# Patient Record
Sex: Male | Born: 1995 | Race: Black or African American | Hispanic: No | Marital: Single | Smoking: Never smoker
Health system: Southern US, Community
[De-identification: ages and names within clinical notes are randomized; demographics above are authoritative.]

## PROBLEM LIST (undated history)

## (undated) DIAGNOSIS — J45909 Unspecified asthma, uncomplicated: Secondary | ICD-10-CM

## (undated) DIAGNOSIS — Z8659 Personal history of other mental and behavioral disorders: Secondary | ICD-10-CM

## (undated) DIAGNOSIS — L309 Dermatitis, unspecified: Secondary | ICD-10-CM

## (undated) DIAGNOSIS — F909 Attention-deficit hyperactivity disorder, unspecified type: Secondary | ICD-10-CM

## (undated) DIAGNOSIS — T7840XA Allergy, unspecified, initial encounter: Secondary | ICD-10-CM

## (undated) DIAGNOSIS — R4689 Other symptoms and signs involving appearance and behavior: Secondary | ICD-10-CM

## (undated) DIAGNOSIS — Z62820 Parent-biological child conflict: Secondary | ICD-10-CM

## (undated) HISTORY — DX: Allergy, unspecified, initial encounter: T78.40XA

## (undated) HISTORY — DX: Unspecified asthma, uncomplicated: J45.909

---
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
## 2017-02-01 ENCOUNTER — Encounter: Payer: Self-pay | Admitting: Allergy and Immunology

## 2017-02-01 ENCOUNTER — Ambulatory Visit (INDEPENDENT_AMBULATORY_CARE_PROVIDER_SITE_OTHER): Payer: BLUE CROSS/BLUE SHIELD | Admitting: Allergy and Immunology

## 2017-02-01 VITALS — BP 140/80 | HR 100 | Resp 18 | Ht 71.0 in | Wt 280.0 lb

## 2017-02-01 DIAGNOSIS — L2089 Other atopic dermatitis: Secondary | ICD-10-CM | POA: Diagnosis not present

## 2017-02-01 DIAGNOSIS — Z91018 Allergy to other foods: Secondary | ICD-10-CM | POA: Diagnosis not present

## 2017-02-01 DIAGNOSIS — J4520 Mild intermittent asthma, uncomplicated: Secondary | ICD-10-CM

## 2017-02-01 DIAGNOSIS — J3089 Other allergic rhinitis: Secondary | ICD-10-CM

## 2017-02-01 MED ORDER — AUVI-Q 0.3 MG/0.3ML IJ SOAJ: INTRAMUSCULAR | 3 refills | Status: DC

## 2017-02-01 MED ORDER — ALBUTEROL SULFATE 108 (90 BASE) MCG/ACT IN AEPB: 2.0000 | INHALATION_SPRAY | RESPIRATORY_TRACT | 1 refills | Status: AC | PRN

## 2017-02-01 NOTE — Patient Instructions (Addendum)
  1. OTC antihistamine if needed. OTC moisturizer if needed  2. Auvi-Q 0.3, Benadryl, M.D./ER evaluation for severe allergic reaction  3. Proventil HFA or similar 2 inhalations every 4-6 hours if needed  4. OTC Mucinex DM 2 tablets twice a day if needed  5. Return to clinic in 1 year or earlier if problem  6. Restart immunotherapy? Not at this point in time

## 2017-02-01 NOTE — Progress Notes (Signed)
Follow-up Note  Referring Provider: Dorothyann Peng, MD Primary Provider: Dorothyann Peng, MD Date of Office Visit: 02/01/2017  Subjective:   Jonathan Zamora (DOB: [birth year]) is a 21 y.o. male who [facility] in re-evaluation of the following:  HPI: Jonathan Zamora presents to this clinic in evaluation of his allergic rhinitis, asthma, atopic dermatitis and history of food allergy directed against tree nuts. I have not seen him in this clinic since April 2016.  He was using immunotherapy to treat his atopic disease and he discontinued this form of therapy in September 2017 while he was utilizing an every 4 week schedule. Logistically it was just difficult for him to continue.  He did very well without any problems until about late February at which point time he became acutely sick with nasal congestion and coughing and sneezing and may be wheezing and posttussive emesis for which he went to the infirmary at [facility] and was treated with antibiotics. Apparently he did very well with therapy and since then has had very little problems with his nose or eyes or throat or chest or stomach. Basically he is asymptomatic at this point in time. He does not use any nasal steroids or antihistamines or inhaled steroids or a short acting bronchodilator.  He does remain away from tree nut consumption.  His eczema is rarely active and he has no need to use any topical steroid.  Allergies as of 02/01/2017      Reactions   Other Anaphylaxis   ALL Tree nuts      Medication List      benzonatate 100 MG capsule Commonly known as:  TESSALON Take 1-2 capsules (100-200 mg total) by mouth 3 (three) times daily as needed for cough.   cetirizine 10 MG tablet Commonly known as:  ZYRTEC Take 10 mg by mouth daily.   CONCERTA 36 MG CR tablet Generic drug:  methylphenidate   EPIPEN 2-PAK 0.3 mg/0.3 mL Soaj injection Generic drug:   EPINEPHrine USE AS DIRECTED FOR LIFE THREATENING ALLERGIC REACTIONS   fluticasone 50 MCG/ACT nasal spray Commonly known as:  FLONASE Place 1 spray into both nostrils daily.   guanFACINE 2 MG tablet Commonly known as:  TENEX Take 1 tablet (2 mg total) by mouth 2 (two) times daily.   PROAIR RESPICLICK 108 (90 Base) MCG/ACT Aepb Generic drug:  Albuterol Sulfate USE 2 INHALATIONS EVERY 4 TO 6 HOURS AS NEEDED FOR COUGH OR WHEEZE.       Past Medical History:  Diagnosis Date  . ADHD (attention deficit hyperactivity disorder)   . Aggression   . Allergy   . Asthma   . Eczema   . History of ADHD 08/10/2015  . Parent-child relational problem 08/10/2015    History reviewed. No pertinent surgical history.  Review of systems negative except as noted in HPI / PMHx or noted below:  Review of Systems  Constitutional: Negative.   HENT: Negative.   Eyes: Negative.   Respiratory: Negative.   Cardiovascular: Negative.   Gastrointestinal: Negative.   Genitourinary: Negative.   Musculoskeletal: Negative.   Skin: Negative.   Neurological: Negative.   Endo/Heme/Allergies: Negative.   Psychiatric/Behavioral: Negative.      Objective:   Vitals:   02/01/17 1534  BP: 140/80  Pulse: 100  Resp: 18   Height: 5\' 11"  (180.3 cm)  Weight: 280 lb (127 kg)   Physical Exam  Constitutional: He is well-developed, well-nourished, and in no  distress.  HENT:  Head: Normocephalic.  Right Ear: Tympanic membrane, external ear and ear canal normal.  Left Ear: Tympanic membrane, external ear and ear canal normal.  Nose: Nose normal. No mucosal edema or rhinorrhea.  Mouth/Throat: Uvula is midline, oropharynx is clear and moist and mucous membranes are normal. No oropharyngeal exudate.  Eyes: Conjunctivae are normal.  Neck: Trachea normal. No tracheal tenderness present. No tracheal deviation present. No thyromegaly present.  Cardiovascular: Normal rate, regular rhythm, S1 normal, S2 normal and  normal heart sounds.   No murmur heard. Pulmonary/Chest: Breath sounds normal. No stridor. No respiratory distress. He has no wheezes. He has no rales.  Musculoskeletal: He exhibits no edema.  Lymphadenopathy:       Head (right side): No tonsillar adenopathy present.       Head (left side): No tonsillar adenopathy present.    He has no cervical adenopathy.  Neurological: He is alert. Gait normal.  Skin: No rash noted. He is not diaphoretic. No erythema. Nails show no clubbing.  Psychiatric: Mood and affect normal.    Diagnostics:    Spirometry was performed and demonstrated an FEV1 of 3.65 at 97 % of predicted.  The patient had an Asthma Control Test with the following results:  .    Assessment and Plan:   1. Asthma, mild intermittent, well-controlled   2. Other allergic rhinitis   3. Food allergy   4. Other atopic dermatitis     1. OTC antihistamine if needed. OTC moisturizer if needed ` 2. Auvi-Q 0.3, Benadryl, M.D./ER evaluation for severe allergic reaction  3. Proventil HFA or similar 2 inhalations every 4-6 hours if needed  4. OTC Mucinex DM 2 tablets twice a day if needed  5. Return to clinic in 1 year or earlier if problem  6. Restart immunotherapy? Not at this point in time  [name] appears to be doing relatively well. I suspect that the event that occurred late February was probably tied up with a viral respiratory tract infection and we will make that assumption and not have him use any medications in a preventative manner or have him restart immunotherapy. We will see what happens as we go through this next year and if he has significant problems as he progresses then we will need to have him reapproach this issue and possibly consider restarting him on a course immunotherapy. I will see him back in this clinic in 1 year or earlier if there is a problem.  Laurette SchimkeEric Senita Corredor, MD Allergy / Immunology [facility]

## 2017-02-02 NOTE — Addendum Note (Signed)
Addended by: Berna BueWHITAKER, CARRIE L on: 02/02/2017 12:10 PM   Modules accepted: Orders
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
## 2017-11-01 ENCOUNTER — Emergency Department (HOSPITAL_COMMUNITY)
Admission: EM | Admit: 2017-11-01 | Discharge: 2017-11-01 | Disposition: A | Discharge status: Other Acute Inpatient Hospital | Payer: Self-pay | Attending: Emergency Medicine | Admitting: Emergency Medicine

## 2017-11-01 ENCOUNTER — Inpatient Hospital Stay (HOSPITAL_COMMUNITY)
Admission: AD | Admit: 2017-11-01 | Discharge: 2017-11-04 | DRG: 883 | Disposition: A | Discharge status: Home / Self Care | Payer: Federal, State, Local not specified - Other | Source: Intra-hospital | Attending: Psychiatry | Admitting: Psychiatry

## 2017-11-01 ENCOUNTER — Encounter (HOSPITAL_COMMUNITY): Payer: Self-pay

## 2017-11-01 ENCOUNTER — Other Ambulatory Visit: Payer: Self-pay

## 2017-11-01 DIAGNOSIS — R4689 Other symptoms and signs involving appearance and behavior: Secondary | ICD-10-CM | POA: Insufficient documentation

## 2017-11-01 DIAGNOSIS — F909 Attention-deficit hyperactivity disorder, unspecified type: Secondary | ICD-10-CM | POA: Diagnosis present

## 2017-11-01 DIAGNOSIS — Z23 Encounter for immunization: Secondary | ICD-10-CM

## 2017-11-01 DIAGNOSIS — F6381 Intermittent explosive disorder: Principal | ICD-10-CM | POA: Diagnosis present

## 2017-11-01 DIAGNOSIS — Z91018 Allergy to other foods: Secondary | ICD-10-CM | POA: Diagnosis not present

## 2017-11-01 DIAGNOSIS — F329 Major depressive disorder, single episode, unspecified: Secondary | ICD-10-CM | POA: Diagnosis present

## 2017-11-01 DIAGNOSIS — Z008 Encounter for other general examination: Secondary | ICD-10-CM

## 2017-11-01 DIAGNOSIS — J45909 Unspecified asthma, uncomplicated: Secondary | ICD-10-CM | POA: Diagnosis present

## 2017-11-01 DIAGNOSIS — R45851 Suicidal ideations: Secondary | ICD-10-CM | POA: Diagnosis present

## 2017-11-01 DIAGNOSIS — L209 Atopic dermatitis, unspecified: Secondary | ICD-10-CM | POA: Diagnosis present

## 2017-11-01 DIAGNOSIS — Z046 Encounter for general psychiatric examination, requested by authority: Secondary | ICD-10-CM

## 2017-11-01 LAB — CBC
HCT: 42.6 % (ref 39.0–52.0)
Hemoglobin: 14.7 g/dL (ref 13.0–17.0)
MCH: 29.8 pg (ref 26.0–34.0)
MCHC: 34.5 g/dL (ref 30.0–36.0)
MCV: 86.4 fL (ref 78.0–100.0)
Platelets: 281 10*3/uL (ref 150–400)
RBC: 4.93 MIL/uL (ref 4.22–5.81)
RDW: 12.5 % (ref 11.5–15.5)
WBC: 7.1 10*3/uL (ref 4.0–10.5)

## 2017-11-01 LAB — COMPREHENSIVE METABOLIC PANEL
ALT: 22 U/L (ref 17–63)
AST: 27 U/L (ref 15–41)
Albumin: 4.6 g/dL (ref 3.5–5.0)
Alkaline Phosphatase: 81 U/L (ref 38–126)
Anion gap: 10 (ref 5–15)
BUN: 10 mg/dL (ref 6–20)
CO2: 24 mmol/L (ref 22–32)
Calcium: 9.3 mg/dL (ref 8.9–10.3)
Chloride: 106 mmol/L (ref 101–111)
Creatinine, Ser: 0.97 mg/dL (ref 0.61–1.24)
GFR calc Af Amer: >60 mL/min (ref >60)
GFR calc non Af Amer: >60 mL/min (ref >60)
Glucose, Bld: 92 mg/dL (ref 65–99)
Potassium: 3.7 mmol/L (ref 3.5–5.1)
Sodium: 140 mmol/L (ref 135–145)
Total Bilirubin: 1.3 mg/dL — ABNORMAL HIGH (ref 0.3–1.2)
Total Protein: 7.4 g/dL (ref 6.5–8.1)

## 2017-11-01 LAB — RAPID URINE DRUG SCREEN, HOSP PERFORMED
Amphetamines: NOT DETECTED
Barbiturates: NOT DETECTED
Benzodiazepines: NOT DETECTED
Cocaine: NOT DETECTED
Opiates: NOT DETECTED
Tetrahydrocannabinol: POSITIVE — AB

## 2017-11-01 LAB — ACETAMINOPHEN LEVEL: Acetaminophen (Tylenol), Serum: <10 ug/mL — ABNORMAL LOW (ref 10–30)

## 2017-11-01 LAB — ETHANOL: Alcohol, Ethyl (B): <10 mg/dL (ref <10)

## 2017-11-01 LAB — SALICYLATE LEVEL: Salicylate Lvl: <7 mg/dL (ref 2.8–30.0)

## 2017-11-01 MED ORDER — TRAZODONE HCL 50 MG PO TABS: 50.0000 mg | ORAL_TABLET | Freq: Every evening | ORAL | Status: DC | PRN

## 2017-11-01 MED ORDER — ALUM & MAG HYDROXIDE-SIMETH 200-200-20 MG/5ML PO SUSP: 30.0000 mL | ORAL | Status: DC | PRN

## 2017-11-01 MED ORDER — INFLUENZA VAC SPLIT QUAD 0.5 ML IM SUSY
0.5000 mL | PREFILLED_SYRINGE | INTRAMUSCULAR | Status: AC
  Administered 2017-11-02: 0.5 mL via INTRAMUSCULAR
  Filled 2017-11-01: qty 0.5

## 2017-11-01 MED ORDER — ACETAMINOPHEN 325 MG PO TABS: 650.0000 mg | ORAL_TABLET | ORAL | Status: DC | PRN

## 2017-11-01 MED ORDER — MAGNESIUM HYDROXIDE 400 MG/5ML PO SUSP: 30.0000 mL | Freq: Every day | ORAL | Status: DC | PRN

## 2017-11-01 MED ORDER — PNEUMOCOCCAL VAC POLYVALENT 25 MCG/0.5ML IJ INJ: 0.5000 mL | INJECTION | INTRAMUSCULAR | Status: DC

## 2017-11-01 MED ORDER — HYDROXYZINE HCL 25 MG PO TABS: 25.0000 mg | ORAL_TABLET | Freq: Three times a day (TID) | ORAL | Status: DC | PRN

## 2017-11-01 MED ORDER — ACETAMINOPHEN 325 MG PO TABS: 650.0000 mg | ORAL_TABLET | Freq: Four times a day (QID) | ORAL | Status: DC | PRN

## 2017-11-01 MED ORDER — ONDANSETRON HCL 4 MG PO TABS: 4.0000 mg | ORAL_TABLET | Freq: Three times a day (TID) | ORAL | Status: DC | PRN
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
## 2017-11-01 NOTE — ED Notes (Signed)
Patient being evaluated by TTS °

## 2017-11-01 NOTE — ED Notes (Signed)
[facility] for continuation of specialized care. Pt left in no acute distress. Belongings signed for and given to father to take home. Pt left in no acute distress.

## 2017-11-01 NOTE — ED Notes (Signed)
Patient currently denies SI/HI/AVH at this time. Plan of care discussed. EDP at bedside to discuss treatment plan. Encouragement and support provided and safety maintain. Q 15 min safety checks remain in place and video monitoring.

## 2017-11-01 NOTE — ED Notes (Addendum)
[facility] [employer] contacted for patient transport to [facility]. 

## 2017-11-01 NOTE — Tx Team (Signed)
Initial Treatment Plan 11/01/2017 10:48 PM Jonathan Zamora G Silvester UEA:[id number]    PATIENT STRESSORS: Financial difficulties Marital or family conflict Substance abuse   PATIENT STRENGTHS: Wellsite geologistCommunication skills General fund of knowledge Motivation for treatment/growth Physical Health   PATIENT IDENTIFIED PROBLEMS: Depression  Suicidal ideation  Aggitation  "I want help with dealing with stress better"               DISCHARGE CRITERIA:  Improved stabilization in mood, thinking, and/or behavior Verbal commitment to aftercare and medication compliance  PRELIMINARY DISCHARGE PLAN: Outpatient therapy Medication management  PATIENT/FAMILY INVOLVEMENT: This treatment plan has been presented to and reviewed with the patient, Jonathan Zamora G Pettaway.  The patient and family have been given the opportunity to ask questions and make suggestions.  Levin BaconHeather V Chrystie Hagwood, RN 11/01/2017, 10:48 PM
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
## 2017-11-01 NOTE — ED Notes (Signed)
No E-signature required patient is IVC'D

## 2017-11-01 NOTE — [facility] Counselor (Signed)
Pt accepted to [facility] per Tanna SavoyEric AC. Can come after 1930. Dr. Sharma CovertNorman Accepting, Dr. Jama Flavorsobos attending. RN notified.   RN to RN report [phone]   Kateri PlummerKristin Felton Buczynski, Darcey NoraLCAS, [facility] Lead Triage Specialist  [facility]  Therapeutic Triage Services Phone: [phone] Fax: [phone]

## 2017-11-01 NOTE — ED Notes (Signed)
Pt oriented to room and unit.  Pt is quiet and withdrawn.  Pt was wanded again and checked for contraband.  15 minute checks and video monitoring in place.

## 2017-11-01 NOTE — ED Notes (Signed)
Pt left fathers contact numbers.  Darin Engelseginald Paster [phone] and [phone].

## 2017-11-01 NOTE — ED Notes (Signed)
Bed: WA27 Expected date:  Expected time:  Means of arrival:  Comments: [employer] IVC 

## 2017-11-01 NOTE — ED Notes (Signed)
Patient is visiting with his father.
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
## 2017-11-01 NOTE — Progress Notes (Signed)
Clearance Jonathan Zamora is a 22 year old male being admitted involuntarily to [facility] from [facility].  He came to the ED under IVC for suicidal ideation and agitation towards family.  [facility], he was appropriate, pleasant and cooperative.  He denies any SI/HI or A/V hallucinations.  He was alert and oriented x 4.  He reported that he never sent the text to his mother and he reported that was confirmed by the police getting copies of his text messages.   He has a Therapist, sportspsychiatrist and therapist in the community.  He admits to using marijuana occasionally.  He wants his father to be involved in his treatment here.  Encouraged him to talk with the doctor and the social work about his father being involved.  He denies any pain or discomfort and appears to be in no physical distress.  Oriented him to the unit.  Admission paperwork completed and signed.  Belongings searched and secured in locker # 54, no contraband found.  Skin assessment completed and no skin issues noted.  Q 15 minute checks initiated for safety.  We will continue to monitor the progress towards his goals.

## 2017-11-02 NOTE — Tx Team (Signed)
Interdisciplinary Treatment and Diagnostic Plan Update  11/02/2017 Time of Session: 10:00a Jonathan Zamora MRN: [id number]  Principal Diagnosis: Disruptive Mood Dysregulation Disorder   Secondary Diagnoses: Active Problems:   MDD (major depressive disorder)   Current Medications:  Current Facility-Administered Medications  Medication Dose Route Frequency Provider Last Rate Last Dose  . acetaminophen (TYLENOL) tablet 650 mg  650 mg Oral Q6H PRN Okonkwo, Justina A, NP      . alum & mag hydroxide-simeth (MAALOX/MYLANTA) 200-200-20 MG/5ML suspension 30 mL  30 mL Oral Q4H PRN Okonkwo, Justina A, NP      . hydrOXYzine (ATARAX/VISTARIL) tablet 25 mg  25 mg Oral TID PRN Lu Duffel, Justina A, NP      . Influenza vac split quadrivalent PF (FLUARIX) injection 0.5 mL  0.5 mL Intramuscular Tomorrow-1000 Cobos, Fernando A, MD      . magnesium hydroxide (MILK OF MAGNESIA) suspension 30 mL  30 mL Oral Daily PRN Okonkwo, Justina A, NP      . pneumococcal 23 valent vaccine (PNU-IMMUNE) injection 0.5 mL  0.5 mL Intramuscular Tomorrow-1000 Cobos, Fernando A, MD      . traZODone (DESYREL) tablet 50 mg  50 mg Oral QHS PRN Okonkwo, Justina A, NP       PTA Medications: Medications Prior to Admission  Medication Sig Dispense Refill Last Dose  . Albuterol Sulfate (PROAIR RESPICLICK) 370 (90 Base) MCG/ACT AEPB Inhale 2 puffs into the lungs every 4 (four) hours as needed. (Patient not taking: Reported on 11/01/2017) 1 each 1 Not Taking at Unknown time  . AUVI-Q 0.3 MG/0.3ML SOAJ injection Use as directed for life threatening allergic reactions (Patient not taking: Reported on 11/01/2017) 4 Device 3 Not Taking at Unknown time  . guanFACINE (TENEX) 2 MG tablet Take 1 tablet (2 mg total) by mouth 2 (two) times daily. (Patient not taking: Reported on 11/01/2017) 60 tablet 0 Not Taking at Unknown time    Patient Stressors: Financial difficulties Marital or family conflict Substance abuse  Patient Strengths:  Curator fund of knowledge Motivation for treatment/growth Physical Health  Treatment Modalities: Medication Management, Group therapy, Case management,  1 to 1 session with clinician, Psychoeducation, Recreational therapy.   Physician Treatment Plan for Primary Diagnosis: Disruptive Mood Dysregulation Disorder  Medication Management: Evaluate patient's response, side effects, and tolerance of medication regimen.  Therapeutic Interventions: 1 to 1 sessions, Unit Group sessions and Medication administration.  Evaluation of Outcomes: Not Met  Physician Treatment Plan for Secondary Diagnosis: Active Problems:   MDD (major depressive disorder)    Medication Management: Evaluate patient's response, side effects, and tolerance of medication regimen.  Therapeutic Interventions: 1 to 1 sessions, Unit Group sessions and Medication administration.  Evaluation of Outcomes: Not Met   RN Treatment Plan for Primary Diagnosis: Disruptive Mood Dysregulation Disorder  Long Term Goal(s): Knowledge of disease and therapeutic regimen to maintain health will improve  Short Term Goals: Ability to remain free from injury will improve, Ability to verbalize frustration and anger appropriately will improve, Ability to demonstrate self-control, Ability to participate in decision making will improve, Ability to verbalize feelings will improve, Ability to disclose and discuss suicidal ideas, Ability to identify and develop effective coping behaviors will improve and Compliance with prescribed medications will improve  Medication Management: RN will administer medications as ordered by provider, will assess and evaluate patient's response and provide education to patient for prescribed medication. RN will report any adverse and/or side effects to prescribing provider.  Therapeutic Interventions: 1 on 1  counseling sessions, Psychoeducation, Medication administration, Evaluate responses to  treatment, Monitor vital signs and CBGs as ordered, Perform/monitor CIWA, COWS, AIMS and Fall Risk screenings as ordered, Perform wound care treatments as ordered.  Evaluation of Outcomes: Not Met   LCSW Treatment Plan for Primary Diagnosis: Disruptive Mood Dysregulation Disorder   Long Term Goal(s): Safe transition to appropriate next level of care at discharge, Engage patient in therapeutic group addressing interpersonal concerns.  Short Term Goals: Engage patient in aftercare planning with referrals and resources, Increase social support, Increase ability to appropriately verbalize feelings, Increase emotional regulation, Facilitate acceptance of mental health diagnosis and concerns, Facilitate patient progression through stages of change regarding substance use diagnoses and concerns, Identify triggers associated with mental health/substance abuse issues and Increase skills for wellness and recovery  Therapeutic Interventions: Assess for all discharge needs, 1 to 1 time with Social worker, Explore available resources and support systems, Assess for adequacy in community support network, Educate family and significant other(s) on suicide prevention, Complete Psychosocial Assessment, Interpersonal group therapy.  Evaluation of Outcomes: Not Met   Progress in Treatment: Attending groups: No. New on the unit  Participating in groups: No. New on the unit  Taking medication as prescribed: Yes. Toleration medication: Yes. Family/Significant other contact made: Yes, individual(s) contacted:  the patient's father, Yan Pankratz Patient understands diagnosis: Yes. Discussing patient identified problems/goals with staff: Yes. Medical problems stabilized or resolved: Yes. Denies suicidal/homicidal ideation: Yes. Issues/concerns per patient self-inventory: No. Other:   New problem(s) identified: None   New Short Term/Long Term Goal(s): medication stabilization, elimination of SI thoughts,  development of comprehensive mental wellness plan.   Patient Goal: "Find better ways to deal with stress"   Discharge Plan or Barriers: CSW will continue to assess for appropriate referrals and discharge plans.    Reason for Continuation of Hospitalization: Anxiety Depression Medication stabilization Suicidal ideation  Estimated Length of Stay: 11/08/17  Attendees: Patient: Cristopher Ciccarelli  11/02/2017 8:40 AM  Physician: Dr. Leanord Hawking 11/02/2017 8:40 AM  Nursing: Kieth Brightly, RN  11/02/2017 8:40 AM  RN Care Manager: 11/02/2017 8:40 AM  Social Worker: Radonna Ricker, [place] 11/02/2017 8:40 AM  Recreational Therapist:  11/02/2017 8:40 AM  Other:  11/02/2017 8:40 AM  Other:  11/02/2017 8:40 AM  Other: 11/02/2017 8:40 AM    Scribe for Treatment Team: Marylee Floras, [place] 11/02/2017 8:40 AM
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
## 2017-11-02 NOTE — Progress Notes (Signed)
Recreation Therapy Notes  Date: 11/02/17 Time: 0930 Location: [address] Dayroom  Group Topic: Stress Management  Goal Area(s) Addresses:  Patient will verbalize importance of using healthy stress management.  Patient will identify positive emotions associated with healthy stress management.   Intervention: Stress Management  Activity :  LRT introduced the stress management technique of guided imagery.  LRT read a script to guide patients to envision their peaceful place. Patients were to follow along as LRT read script.  Education:  Stress Management, Discharge Planning.   Education Outcome: Acknowledges edcuation/In group clarification offered/Needs additional education  Clinical Observations/Feedback: Pt did not attend group.    Alex Mcmanigal, LRT/CTRS         Jamiel Goncalves A 11/02/2017 11:35 AM 

## 2017-11-02 NOTE — [facility] Group Notes (Signed)
[facility] Mental Health Association Group Therapy      11/02/2017 12:09 PM  Type of Therapy: Mental Health Association Presentation  Participation Level: Active  Participation Quality: Attentive  Affect: Appropriate  Cognitive: Oriented  Insight: Developing/Improving  Engagement in Therapy: Engaged  Modes of Intervention: Discussion, Education and Socialization  Summary of Progress/Problems: Mental Health Association (MHA) Speaker came to talk about his personal journey with mental health. The pt processed ways by which to relate to the speaker. MHA speaker provided handouts and educational information pertaining to groups and services offered by the MHA. Pt was engaged in speaker's presentation and was receptive to resources provided.    Cathren Sween LCSWA Clinical Social Worker   
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
## 2017-11-02 NOTE — [facility] Counselor (Addendum)
Adult Comprehensive Assessment  Patient ID: Jonathan Zamora, male   DOB: 12-Jul-1996, 22 y.o.   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  Employment / Job issues: Pt works part time at a funeral home.  Family Relationships: Pt has a strained relationship with his mother.  Financial / Lack of resources (include bankruptcy): Limited income  Housing / Lack of housing: Pt lives with his father in [place] (include injuries & life threatening diseases): N/A Social relationships: N/A Substance abuse: Pt reports smoking Marijuana twice a week  Bereavement / Loss: N/A  Living/Environment/Situation:  Living Arrangements: Parent Living conditions (as described by patient or guardian): "It's good, we get into arguments sometimes" How long has patient lived in current situation?: 20 years  What is atmosphere in current home: Comfortable  Family History:  Marital status: Single Are you sexually active?: No What is your sexual orientation?: Heterosexual  Does patient have children?: No  Childhood History:  By whom was/is the patient raised?: Father Additional childhood history information: Parents divorced 6 years ago  Description of patient's relationship with caregiver when they were a child: "Good with both parents until they divorced", Pt reports mother moved to [place]  Patient's description of current relationship with people who raised him/her: "I'm great with my dad,  but it is up and down with my mom" Does patient have siblings?: Yes Number of Siblings: 2 Description of patient's current relationship with siblings: "I have only met one of my brothers and I am good with that brother"  Did patient suffer any verbal/emotional/physical/sexual abuse as a child?: No Did patient suffer from severe childhood  neglect?: No Has patient ever been sexually abused/assaulted/raped as an adolescent or adult?: No Was the patient ever a victim of a crime or a disaster?: No Witnessed domestic violence?: No Has patient been effected by domestic violence as an adult?: No  Education:  Highest grade of school patient has completed: 12th  Currently a student?: Yes Name of school: [employer] How long has the patient attended?: 1 semester (3 months)  Learning disability?: No  Employment/Work Situation:   Employment situation: Employed Where is patient currently employed?: Pt is working part time at a funeral home How long has patient been employed?: 6 months  Patient's job has been impacted by current illness: No What is the longest time patient has a held a job?: Two and a half years Where was the patient employed at that time?: A Ec[employer] Has patient ever been in the [employer]?: No Has patient ever served in combat?: No Did You Receive Any Psychiatric Treatment/Services While in Passenger transport manager?: No Are There Guns or Other Weapons in [place]?: No Are These Weapons Safely Secured?: Yes  Financial Resources:   Financial resources: Income from employment Does patient have a representative payee or guardian?: No  Alcohol/Substance Abuse:   What has been your use of drugs/alcohol within the last 12 months?: Pt reports using Marijuana two times a week  If attempted suicide, did drugs/alcohol play a role in this?: No Alcohol/Substance Abuse Treatment Hx: Denies past history Has alcohol/substance abuse ever caused legal problems?: Yes(Pt was cited for possession and paraphinalia )  Social Support System:   Patient's Community Support System: Fair Dietitian Support System: Friends and family  Type of faith/religion: Darrick Meigs  How does patient's faith help to  cope with current illness?: N/A  Leisure/Recreation:   Leisure and Hobbies: Going out with  friends, playing video games   Strengths/Needs:   What things does the patient do well?: "Playing video games" In what areas does patient struggle / problems for patient: "Communication"   Discharge Plan:   Does patient have access to transportation?: Yes Will patient be returning to same living situation after discharge?: Yes Currently receiving community mental health services: Yes (From Whom)(Dr. Amada Jupiter at [facility], Pt has missed the last several appointments) If no, would patient like referral for services when discharged?: No Does patient have financial barriers related to discharge medications?: Yes Patient description of barriers related to discharge medications: Limited income  Summary/Recommendations:   Summary and Recommendations (to be completed by the evaluator): Jonathan Zamora is a 22 year old African American male who has been diagnosed with MDD and Intermittent Explosive Disorder.  He presents with anxiety about being in the hospital. He states that the altercation with his father was over a disgareement and that they have spoken and worked things out between them.  He is very interested in being released from the hospital.  Upon discharge he will return home with his father and will follow up at [facility].  While in the hospital he can benefit from crisis stabilization, medication management, therapeutic milieu, and a referral for services.   Darleen Crocker. 11/02/2017

## 2017-11-02 NOTE — H&P (Signed)
Psychiatric Admission Assessment Adult  Patient Identification: Jonathan Zamora MRN:  [id number] Date of Evaluation:  11/02/2017 Chief Complaint:  Expressed suicidal thoughts at home Principal Diagnosis: Intermittent Explosive Disorder Diagnosis:   Patient Active Problem List   Diagnosis Date Noted  . MDD (major depressive disorder) [F32.9] 11/01/2017  . DMDD (disruptive mood dysregulation disorder) ([place]) [F34.81] 08/11/2015  . History of ADHD [Z86.59] 08/10/2015  . Parent-child relational problem [Z62.820] 08/10/2015  . Aggressive behavior of adolescent [R46.89] 08/09/2015  . 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 Allergic rhinitis [J30.9] 05/31/2015  . Asthma [J45.909] 05/31/2015  . 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 Background history of .Intermittent Explosive Disorder. Presented to the ER via the police. His father committed him involuntarily on account of self harm. He was reported to have sent a text message to his mother that implied he was ready to end his own life. Last weekend he had some issues with his father. He called 911 and his father was arrested.  Routine labs are WNL.  Toxicology is negative. No alcohol or substances.  At interview, patient reports that he had been off medications for over a year. He has been attending therapy. Says he has been functioning well at school and at work. Says over the weekend he had an argument with his father. Says it started after his father asked him to tidy up. Says later he got a text from his father with images of his room. Says when he came back they had an argument which escalated. Says when he sensed it was going to get physical, he called the police himself. Patient says he had a phone conversation with his mother and he was disappointed she shared it with her father. Says he only told her how  disappointed she was. Repeatedly denies ever expressing any suicidal thoughts verbally or electronically. Says he showed his phone to the police and his father. Feels his mother made up stories about him being a danger to himself. Tells me that he is not a danger to anyone or himself. Says he has been feeling good. Says he is more able to control his anger lately. He admits occasional use of THC. Denies use of any other substance. No synthetic substance use   He does not feel depressed. No associated psychosis. No evidence of mania. No overwhelming anxiety. No thoughts of harming others. No thoughts of violence. No access to weapons. Denies any other stressors.    Total Time spent with patient: 1 hour  Past Psychiatric History: History of ADHD as a child. He was treated with Clonidine and Stimulant medication. He has been off medications for over a year. Has a previous admission here for explosive behavior. Denies any past suicidal behavior.   Is the patient at risk to self? No.  Has the patient been a risk to self in the past 6 months? No.  Has the patient been a risk to self within the distant past? No.  Is the patient a risk to others? No.  Has the patient been a risk to others in the past 6 months? No.  Has the patient been a risk to others within the distant past? No.   Prior Inpatient Therapy:   Prior Outpatient Therapy:    Alcohol Screening: 1. How often do you have a drink containing alcohol?: Monthly or less 2. How  many drinks containing alcohol do you have on a typical day when you are drinking?: 1 or 2 3. How often do you have six or more drinks on one occasion?: Never AUDIT-C Score: 1 9. Have you or someone else been injured as a result of your drinking?: No 10. Has a relative or friend or a doctor or another health worker been concerned about your drinking or suggested you cut down?: No Alcohol Use Disorder Identification Test Final Score (AUDIT): 1 Intervention/Follow-up: AUDIT  Score <7 follow-up not indicated Substance Abuse History in the last 12 months:  Yes.   Consequences of Substance Abuse: Past charges for possession and paraphernalia.  Previous Psychotropic Medications: Yes  Psychological Evaluations: Yes  Past Medical History:  Past Medical History:  Diagnosis Date  . ADHD (attention deficit hyperactivity disorder)   . Aggression   . Allergy   . Asthma   . Eczema   . History of ADHD 08/10/2015  . Parent-child relational problem 08/10/2015   History reviewed. No pertinent surgical history. Family History: History reviewed. No pertinent family history. Family Psychiatric  History: Denies any family history of mental illness, substance use disorder or suicide.  Tobacco Screening: Have you used any form of tobacco in the last 30 days? (Cigarettes, Smokeless Tobacco, Cigars, and/or Pipes): No Social History:  Social History   Substance and Sexual Activity  Alcohol Use No  . Alcohol/week: 0.0 oz     Social History   Substance and Sexual Activity  Drug Use No    Additional Social History: Marital status: Single Are you sexually active?: No What is your sexual orientation?: Heterosexual  Does patient have children?: No                         Allergies:   Allergies  Allergen Reactions  . Other Anaphylaxis    ALL Tree nuts   Lab Results:  Results for orders placed or performed during the hospital encounter of 11/01/17 (from the past 48 hour(s))  Comprehensive metabolic panel     Status: Abnormal   Collection Time: 11/01/17  1:40 PM  Result Value Ref Range   Sodium 140 135 - 145 mmol/L   Potassium 3.7 3.5 - 5.1 mmol/L   Chloride 106 101 - 111 mmol/L   CO2 24 22 - 32 mmol/L   Glucose, Bld 92 65 - 99 mg/dL   BUN 10 6 - 20 mg/dL   Creatinine, Ser 0.97 0.61 - 1.24 mg/dL   Calcium 9.3 8.9 - 10.3 mg/dL   Total Protein 7.4 6.5 - 8.1 g/dL   Albumin 4.6 3.5 - 5.0 g/dL   AST 27 15 - 41 U/L   ALT 22 17 - 63 U/L   Alkaline  Phosphatase 81 38 - 126 U/L   Total Bilirubin 1.3 (H) 0.3 - 1.2 mg/dL   GFR calc non Af Amer >60 >60 mL/min   GFR calc Af Amer >60 >60 mL/min    Comment: (NOTE) The eGFR has been calculated using the CKD EPI equation. This calculation has not been validated in all clinical situations. eGFR's persistently <60 mL/min signify possible Chronic Kidney Disease.    Anion gap 10 5 - 15    Comment: Performed at [facility], [place] [address] [place], [place] [address]  Ethanol     Status: None   Collection Time: 11/01/17  1:40 PM  Result Value Ref Range   Alcohol, Ethyl (B) <10 <10 mg/dL  Comment:        LOWEST DETECTABLE LIMIT FOR SERUM ALCOHOL IS 10 mg/dL FOR MEDICAL PURPOSES ONLY Performed at [place] [address] [place], [place] [address]   Salicylate level     Status: None   Collection Time: 11/01/17  1:40 PM  Result Value Ref Range   Salicylate Lvl <0.8 2.8 - 30.0 mg/dL    Comment: Performed at [facility], [place] [address], [place], [place] [address]  Acetaminophen level     Status: Abnormal   Collection Time: 11/01/17  1:40 PM  Result Value Ref Range   Acetaminophen (Tylenol), Serum <10 (L) 10 - 30 ug/mL    Comment:        THERAPEUTIC CONCENTRATIONS VARY SIGNIFICANTLY. A RANGE OF 10-30 ug/mL MAY BE AN EFFECTIVE CONCENTRATION FOR MANY PATIENTS. HOWEVER, SOME ARE BEST TREATED AT CONCENTRATIONS OUTSIDE THIS RANGE. ACETAMINOPHEN CONCENTRATIONS >150 ug/mL AT 4 HOURS AFTER INGESTION AND >50 ug/mL AT 12 HOURS AFTER INGESTION ARE OFTEN ASSOCIATED WITH TOXIC REACTIONS. Performed at [facility], [place] [address], [place], [place] [address]   cbc     Status: None   Collection Time: 11/01/17  1:40 PM  Result Value Ref Range   WBC 7.1 4.0 - 10.5 K/uL   RBC 4.93 4.22 - 5.81 MIL/uL   Hemoglobin 14.7 13.0 - 17.0 g/dL   HCT 42.6 39.0 - 52.0 %   MCV 86.4 78.0 - 100.0 fL   MCH 29.8 26.0 - 34.0 pg    MCHC 34.5 30.0 - 36.0 g/dL   RDW 12.5 11.5 - 15.5 %   Platelets 281 150 - 400 K/uL    Comment: Performed at [facility], [place] [address] [place] [address] [place], [place] [address]  Rapid urine drug screen (hospital performed)     Status: Abnormal   Collection Time: 11/01/17  7:40 PM  Result Value Ref Range   Opiates NONE DETECTED NONE DETECTED   Cocaine NONE DETECTED NONE DETECTED   Benzodiazepines NONE DETECTED NONE DETECTED   Amphetamines NONE DETECTED NONE DETECTED   Tetrahydrocannabinol POSITIVE (A) NONE DETECTED   Barbiturates NONE DETECTED NONE DETECTED    Comment: (NOTE) DRUG SCREEN FOR MEDICAL PURPOSES ONLY.  IF CONFIRMATION IS NEEDED FOR ANY PURPOSE, NOTIFY LAB WITHIN 5 DAYS. LOWEST DETECTABLE LIMITS FOR URINE DRUG SCREEN Drug Class                     Cutoff (ng/mL) Amphetamine and metabolites    1000 Barbiturate and metabolites    200 Benzodiazepine                 580 Tricyclics and metabolites     300 Opiates and metabolites        300 Cocaine and metabolites        300 THC                            50 Performed at [facility], [place] [address], [place], [place] [address]     Blood Alcohol level:  Lab Results  Component Value Date   ETH <10 11/01/2017   ETH <5 82/50/5397    Metabolic Disorder Labs:  No results found for: HGBA1C, MPG No results found for: PROLACTIN No results found for: CHOL, TRIG, HDL, CHOLHDL, VLDL, LDLCALC  Current Medications: Current Facility-Administered Medications  Medication Dose Route Frequency Provider Last Rate Last Dose  . acetaminophen (  TYLENOL) tablet 650 mg  650 mg Oral Q6H PRN Okonkwo, Justina A, NP      . alum & mag hydroxide-simeth (MAALOX/MYLANTA) 200-200-20 MG/5ML suspension 30 mL  30 mL Oral Q4H PRN Okonkwo, Justina A, NP      . hydrOXYzine (ATARAX/VISTARIL) tablet 25 mg  25 mg Oral TID PRN Lu Duffel, Justina A, NP      . magnesium hydroxide (MILK OF MAGNESIA) suspension 30 mL  30 mL Oral Daily  PRN Okonkwo, Justina A, NP      . pneumococcal 23 valent vaccine (PNU-IMMUNE) injection 0.5 mL  0.5 mL Intramuscular Tomorrow-1000 Cobos, Fernando A, MD      . traZODone (DESYREL) tablet 50 mg  50 mg Oral QHS PRN Okonkwo, Justina A, NP       PTA Medications: Medications Prior to Admission  Medication Sig Dispense Refill Last Dose  . Albuterol Sulfate (PROAIR RESPICLICK) 027 (90 Base) MCG/ACT AEPB Inhale 2 puffs into the lungs every 4 (four) hours as needed. (Patient not taking: Reported on 11/01/2017) 1 each 1 Not Taking at Unknown time  . AUVI-Q 0.3 MG/0.3ML SOAJ injection Use as directed for life threatening allergic reactions (Patient not taking: Reported on 11/01/2017) 4 Device 3 Not Taking at Unknown time  . guanFACINE (TENEX) 2 MG tablet Take 1 tablet (2 mg total) by mouth 2 (two) times daily. (Patient not taking: Reported on 11/01/2017) 60 tablet 0 Not Taking at Unknown time    Musculoskeletal: Strength & Muscle Tone: within normal limits Gait & Station: normal Patient leans: N/A  Psychiatric Specialty Exam: Physical Exam  Constitutional: He is oriented to person, place, and time. He appears well-developed and well-nourished.  HENT:  Head: Normocephalic and atraumatic.  Respiratory: Effort normal.  Neurological: He is oriented to person, place, and time.  Psychiatric:  As above    ROS  Blood pressure (!) 145/71, pulse 91, temperature 98 F (36.7 C), temperature source Oral, resp. rate 18, height 5' 10.25" (1.784 m), weight 128.7 kg (283 lb 12 oz).Body mass index is 40.42 kg/m.  General Appearance: Neatly dressed, calm and cooperative. Appropriate behavior.   Eye Contact:  Good  Speech:  Clear and Coherent and Normal Rate  Volume:  Normal  Mood:  Slightly irritated with being here.  Affect:  Appropriate and Full Range  Thought Process:  Linear  Orientation:  Full (Time, Place, and Person)  Thought Content:  No delusional theme. No preoccupation with violent thoughts. No  negative ruminations. No obsession.  No hallucination in any modality.   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 He has some underlying irritability. He does not show any evidence of mania, depression or psychosis. I explored use of Carbamazepine to target impulsivity but patient is refusing any medication at this time. He plans to continue therapy and follow up with his out-patient  provider. We plan to evaluate him further and gather more collateral information.  Psychiatric: Intermittent Explosive Disorder  Medical:  Psychosocial:   PLAN: 1. PRN for agitation and aggression 2. Encourage unit groups and therapeutic activities 3. Monitor mood, behavior and interaction with peers 4. SW would gather collateral from his family and coordinate aftercare 5. Mother to provide copy of text message from patient 6. Allow voluntary status.   Observation Level/Precautions:  15 minute checks  Laboratory:    Psychotherapy:    Medications:    Consultations:    Discharge Concerns:    Estimated LOS:  Other:     Physician Treatment Plan for Primary Diagnosis: <principal problem not specified> Long Term Goal(s): Improvement in symptoms so as ready for discharge  Short Term Goals: Ability to identify changes in lifestyle to reduce recurrence of condition will  improve, Ability to verbalize feelings will improve, Ability to disclose and discuss suicidal ideas, Ability to demonstrate self-control will improve, Ability to identify and develop effective coping behaviors will improve, Ability to maintain clinical measurements within normal limits will improve, Compliance with prescribed medications will improve and Ability to identify triggers associated with substance abuse/mental health issues will improve  Physician Treatment Plan for Secondary Diagnosis: Active Problems:   MDD (major depressive disorder)  Long Term Goal(s): Improvement in symptoms so as ready for discharge  Short Term Goals: Ability to identify changes in lifestyle to reduce recurrence of condition will improve, Ability to verbalize feelings will improve, Ability to disclose and discuss suicidal ideas, Ability to demonstrate self-control will improve, Ability to identify and develop effective coping behaviors will improve, Ability to maintain clinical measurements within normal limits will improve, Compliance with prescribed medications will improve and Ability to identify triggers associated with substance abuse/mental health issues will improve  I certify that inpatient services furnished can reasonably be expected to improve the patient's condition.    Artist [place], MD 2/13/20191:54 PM

## 2017-11-02 NOTE — [facility] Group Notes (Signed)
[facility] Group Notes:  (Nursing/MHT/Case Management/Adjunct)  Date:  11/02/2017  Time:  3:51 PM  Type of Therapy:  Nurse Education  Participation Level:  Active  Participation Quality:  Appropriate  Affect:  Appropriate  Cognitive:  Appropriate  Insight:  Appropriate  Engagement in Group:  Engaged  Modes of Intervention:  Activity, Discussion and Education  Summary of Progress/Problems:  This was an educational group focusing on the importance of training the brain with positive messages and replacing negative messages, especially those that  are deeply imprinted and play without active thought.    Almira Barenny G Jami Ohlin 11/02/2017, 3:51 PM

## 2017-11-02 NOTE — Progress Notes (Addendum)
Data. Patient denies SI/HI/AVH. Verbally contracts for safety on the unit and to come to staff before acting of any self harm thoughts/feelings. Patient's affect has been bright and his mood has been calm and cheerful.  Patient interacting well with staff and other patients. On his self assessment patient reports 0/10 for depression, anxiety and hopelessness. His goal for today is, "Dealing with stress." Action. Emotional support and encouragement offered. Education provided on medication, indications and side effect. Q 15 minute checks done for safety. Response. Safety on the unit maintained through 15 minute checks.  Medications taken as prescribed. Attended groups. Remained calm and appropriate through out shift.

## 2017-11-03 DIAGNOSIS — G4700 Insomnia, unspecified: Secondary | ICD-10-CM

## 2017-11-03 DIAGNOSIS — F419 Anxiety disorder, unspecified: Secondary | ICD-10-CM

## 2017-11-03 NOTE — Progress Notes (Signed)
Pt did not attend wrap-up group   

## 2017-11-03 NOTE — [facility] Group Notes (Signed)
LCSW Group Therapy Note 11/03/2017 2:52 PM  Type of Therapy/Topic: Group Therapy: Balance in Life  Participation Level: Active  Description of Group:  This group will address the concept of balance and how it feels and looks when one is unbalanced. Patients will be encouraged to process areas in their lives that are out of balance and identify reasons for remaining unbalanced. Facilitators will guide patients in utilizing problem-solving interventions to address and correct the stressor making their life unbalanced. Understanding and applying boundaries will be explored and addressed for obtaining and maintaining a balanced life. Patients will be encouraged to explore ways to assertively make their unbalanced needs known to significant others in their lives, using other group members and facilitator for support and feedback.  Therapeutic Goals: 1. Patient will identify two or more emotions or situations they have that consume much of in their lives. 2. Patient will identify signs/triggers that life has become out of balance:  3. Patient will identify two ways to set boundaries in order to achieve balance in their lives:  4. Patient will demonstrate ability to communicate their needs through discussion and/or role plays  Summary of Patient Progress:  Jonathan Zamora was engaged throughout the group session. He participated and contributed to the group's discussion. Jonathan Zamora states that he plans to appreciate where he is currently, and not focus on tomorrow. He states he wants to learn how to take it "day by day".  Therapeutic Modalities:  Cognitive Behavioral Therapy Solution-Focused Therapy Assertiveness Training   Alcario DroughtJolan Vernal Zamora LCSWA Clinical Social Worker

## 2017-11-03 NOTE — Progress Notes (Signed)
D: Patient denies SI, HI or AVH. Patient is irritable during visitation with his father, initially stating that he wanted him removed from his visitor list "because you cant even say good bye to me right".  Pt. Was verbally deescalated and redirected with his fathers presence and opted to keep him on the list.  Pt. Otherwise calm and cooperative.  Pt. Is visualized in the dayroom interacting with staff and others.  A: Patient given emotional support from RN. Patient encouraged to come to staff with concerns and/or questions. Patient's medication routine continued. Patient's orders and plan of care reviewed.   R: Patient remains appropriate and cooperative. Will continue to monitor patient q15 minutes for safety.

## 2017-11-03 NOTE — Progress Notes (Signed)
  Pt presents with a flat affect and anxious mood. Pt appears to be cautious, guarded and irritable during shift assessment. Pt denies SI/HI. Pt denies depression or anxiety. No concerns verbalized by pt during shift assessment. Pt verbalized to Clinical research associatewriter that he was IVC'd by his father after his mother accused him of sending a suicidal text message. Per pt, he had a verbal altercation with his mother that escalated and she didn't like the way he was talking to her. Pt verbalized that he never sent a suicidal text message to his mother and that she doesn't have proof. Pt stated that he and his father both agreed that he needs to return back home as soon as possible. Orders reviewed. Verbal support provided. Pt encouraged to attend groups. 15 minute checks performed for safety.

## 2017-11-03 NOTE — [facility] Group Notes (Signed)
Adult Psychoeducational Group Note  Date:  11/03/2017 Time:  9:12 AM  Group Topic/Focus:  Goals Group:   The focus of this group is to help patients establish daily goals to achieve during treatment and discuss how the patient can incorporate goal setting into their daily lives to aide in recovery.  Participation Level:  Active  Participation Quality:  Appropriate  Affect:  Appropriate  Cognitive:  Alert  Insight: Good  Engagement in Group:  Engaged  Modes of Intervention:  Orientation  Additional Comments: Pt participated in goals/orientation group. Pt goal for today is to ease frustration.   Dellia NimsJaquesha M Yariel Ferraris 11/03/2017, 9:12 AM

## 2017-11-03 NOTE — Progress Notes (Signed)
CSW contacted the patient's father, Jonathan Zamora [phone]) to obtain collateral information.   Per [name], he spoke with the patient regarding a discharge plan and a plan for when he returns home.[name] reports that he and the patient had a good conversation and that he feels that the patient will be in a better place, once he is ready and discharges from the hospital. Per [name], the patient has a therapist, Dr. Lewis MoccasinEdward Morris. [name] reports that he and the pateint agreed that in order for the patient to continue to live with him, he will continue to see Dr. Langston MaskerMorris, however will go to weekly sessions.   [name] also states that he plans to attend therapy sessions with the patient, so that he can give his feedback and also learn alternative ways to engaged with his son.  CSW asked [name] why the patient left [employer]. Per [name], the patient didn't attend class during his 1st semester due to being away from home for the first time, which he found challenging. [name] reports that the patient did well in his classes the 2nd semester, however after that semester, the patient chose to leave [employer] due to not liking the "instruction" and because he wanted to explore cheaper options. The following semester, the patient attended [employer]. [name] states that he believes the patient experienced a culture shock going to [employer], due to him growing up and attending school in predominately white communities.   [name] also shared that he admits that  he can begin to handle things better with the patient, and plans to make those adjustments so that he and [name] can move on and continue to build their father and son bond. [name] did report that he hopes that the patient can learn ways that he can begin to mend his relationship with his mother moving forward.   [name] reports that he does not have any concerns about the patient returning home, now that they have discussed and  agreed to a an action plan. CSW will continue to follow.    Baldo DaubJolan Clyde Upshaw, MSW, LCSWA Clinical Social Worker [facility]  Phone: [phone]

## 2017-11-03 NOTE — Progress Notes (Signed)
Pt attend did not  wrap up group.

## 2017-11-03 NOTE — [facility] Group Notes (Signed)
Adult Psychoeducational Group Note  Date:  11/03/2017 Time:  10:50 AM  Group Topic/Focus:  Activity  Participation Level:  Active  Participation Quality:  Appropriate  Affect:  Appropriate  Cognitive:  Appropriate  Insight: Appropriate  Engagement in Group:  Engaged  Modes of Intervention:  Activity  Additional Comments:  Pt was alert and engaged in therapeutic television topic on vulnerability.  Dellia NimsJaquesha M Jurrell Royster 11/03/2017, 10:50 AM

## 2017-11-03 NOTE — Progress Notes (Signed)
[facility] MD Progress Note  11/03/2017 1:44 PM Jonathan Zamora  MRN:  [id number]   Subjective:  Patient reports that he is doing good. He states that he had a phone call with his mom, who lives in [place], and it did not go well and he had to hang up with her. He then talked to his dad, who he lives with, and they had a good conversation and have made plans for him to go to therapy more often and him and his dad are planning therapy together as well. He denies nay medication side effects. He is denying SI/HI/AVH.   Objective: Patient's chart and findings reviewed and discussed with treatment team. Patient presents in the day room attending group. He is pleasant and cooperative. He has communicated with his dad and is future oriented. Will request CSW to get collateral from father.     Principal Problem: Intermittent explosive disorder Diagnosis:   Patient Active Problem List   Diagnosis Date Noted  . Intermittent explosive disorder [F63.81] 11/01/2017  . DMDD (disruptive mood dysregulation disorder) (HCC) [F34.81] 08/11/2015  . History of ADHD [Z86.59] 08/10/2015  . Parent-child relational problem [Z62.820] 08/10/2015  . Aggressive behavior of adolescent [R46.89] 08/09/2015  . 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 Allergic rhinitis [J30.9] 05/31/2015  . Asthma [J45.909] 05/31/2015  . Atopic dermatitis [L20.9] 05/31/2015   Total Time spent with patient: 15 minutes  Past Psychiatric History: See H&P  Past Medical History:  Past Medical History:  Diagnosis Date  . ADHD (attention deficit hyperactivity disorder)   . Aggression   . Allergy   . Asthma   . Eczema   . History of ADHD 08/10/2015  . Parent-child relational problem 08/10/2015   History reviewed. No pertinent surgical history. Family History: History reviewed. No pertinent family history. Family Psychiatric  History: See H&P Social History:  Social History   Substance and Sexual Activity  Alcohol Use  No  . Alcohol/week: 0.0 oz     Social History   Substance and Sexual Activity  Drug Use No    Social History   Socioeconomic History  . Marital status: Single    Spouse name: None  . Number of children: None  . Years of education: None  . Highest education level: None  Social Needs  . Financial resource strain: None  . Food insecurity - worry: None  . Food insecurity - inability: None  . Transportation needs - medical: None  . Transportation needs - non-medical: None  Occupational History  . None  Tobacco Use  . Smoking status: Never Smoker  . Smokeless tobacco: Never Used  Substance and Sexual Activity  . Alcohol use: No    Alcohol/week: 0.0 oz  . Drug use: No  . Sexual activity: Yes  Other Topics Concern  . None  Social History Narrative  . None   Additional Social History:                         Sleep: Good  Appetite:  Good  Current Medications: Current Facility-Administered Medications  Medication Dose Route Frequency Provider Last Rate Last Dose  . acetaminophen (TYLENOL) tablet 650 mg  650 mg Oral Q6H PRN Okonkwo, Justina A, NP      . alum & mag hydroxide-simeth (MAALOX/MYLANTA) 200-200-20 MG/5ML suspension 30 mL  30 mL Oral Q4H PRN Okonkwo, Justina A, NP      . hydrOXYzine (ATARAX/VISTARIL) tablet 25 mg  25 mg  Oral TID PRN Beryle Lathe, Justina A, NP      . magnesium hydroxide (MILK OF MAGNESIA) suspension 30 mL  30 mL Oral Daily PRN Okonkwo, Justina A, NP      . pneumococcal 23 valent vaccine (PNU-IMMUNE) injection 0.5 mL  0.5 mL Intramuscular Tomorrow-1000 Cobos, Fernando A, MD      . traZODone (DESYREL) tablet 50 mg  50 mg Oral QHS PRN Beryle Lathe, Justina A, NP        Lab Results:  Results for orders placed or performed during the hospital encounter of 11/01/17 (from the past 48 hour(s))  Rapid urine drug screen (hospital performed)     Status: Abnormal   Collection Time: 11/01/17  7:40 PM  Result Value Ref Range   Opiates NONE DETECTED NONE  DETECTED   Cocaine NONE DETECTED NONE DETECTED   Benzodiazepines NONE DETECTED NONE DETECTED   Amphetamines NONE DETECTED NONE DETECTED   Tetrahydrocannabinol POSITIVE (A) NONE DETECTED   Barbiturates NONE DETECTED NONE DETECTED    Comment: (NOTE) DRUG SCREEN FOR MEDICAL PURPOSES ONLY.  IF CONFIRMATION IS NEEDED FOR ANY PURPOSE, NOTIFY LAB WITHIN 5 DAYS. LOWEST DETECTABLE LIMITS FOR URINE DRUG SCREEN Drug Class                     Cutoff (ng/mL) Amphetamine and metabolites    1000 Barbiturate and metabolites    200 Benzodiazepine                 200 Tricyclics and metabolites     300 Opiates and metabolites        300 Cocaine and metabolites        300 THC                            50 Performed at [facility], [address], [place], [place] [address]     Blood Alcohol level:  Lab Results  Component Value Date   ETH <10 11/01/2017   ETH <5 12/27/2015    Metabolic Disorder Labs: No results found for: HGBA1C, MPG No results found for: PROLACTIN No results found for: CHOL, TRIG, HDL, CHOLHDL, VLDL, LDLCALC  Physical Findings: AIMS: Facial and Oral Movements Muscles of Facial Expression: None, normal Lips and Perioral Area: None, normal Jaw: None, normal Tongue: None, normal,Extremity Movements Upper (arms, wrists, hands, fingers): None, normal Lower (legs, knees, ankles, toes): None, normal, Trunk Movements Neck, shoulders, hips: None, normal, Overall Severity Severity of abnormal movements (highest score from questions above): None, normal Incapacitation due to abnormal movements: None, normal Patient's awareness of abnormal movements (rate only patient's report): No Awareness, Dental Status Current problems with teeth and/or dentures?: No Does patient usually wear dentures?: No  CIWA:    COWS:     Musculoskeletal: Strength & Muscle Tone: within normal limits Gait & Station: normal Patient leans: N/A  Psychiatric Specialty  Exam: Physical Exam  Nursing note and vitals reviewed. Constitutional: He is oriented to person, place, and time. He appears well-developed and well-nourished.  Cardiovascular: Normal rate.  Respiratory: Effort normal.  Musculoskeletal: Normal range of motion.  Neurological: He is alert and oriented to person, place, and time.  Skin: Skin is warm.    Review of Systems  Constitutional: Negative.   HENT: Negative.   Eyes: Negative.   Respiratory: Negative.   Cardiovascular: Negative.   Gastrointestinal: Negative.   Genitourinary: Negative.   Musculoskeletal: Negative.   Skin: Negative.  Neurological: Negative.   Endo/Heme/Allergies: Negative.   Psychiatric/Behavioral: Negative.     Blood pressure 119/80, pulse 77, temperature 98.2 F (36.8 C), temperature source Oral, resp. rate 18, height 5' 10.25" (1.784 m), weight 128.7 kg (283 lb 12 oz).Body mass index is 40.42 kg/m.  General Appearance: Casual  Eye Contact:  Good  Speech:  Clear and Coherent and Normal Rate  Volume:  Normal  Mood:  Euthymic  Affect:  Congruent  Thought Process:  Goal Directed and Descriptions of Associations: Intact  Orientation:  Full (Time, Place, and Person)  Thought Content:  WDL  Suicidal Thoughts:  No  Homicidal Thoughts:  No  Memory:  Immediate;   Good Recent;   Good Remote;   Good  Judgement:  Good  Insight:  Good  Psychomotor Activity:  Normal  Concentration:  Concentration: Good and Attention Span: Good  Recall:  Good  Fund of Knowledge:  Good  Language:  Good  Akathisia:  No  Handed:  Right  AIMS (if indicated):     Assets:  Communication Skills Desire for Improvement Financial Resources/Insurance Housing Physical Health Social Support Transportation  ADL's:  Intact  Cognition:  WNL  Sleep:  Number of Hours: 6   Problems Addressed Intermittent explosive disorder  Treatment Plan Summary: Daily contact with patient to assess and evaluate symptoms and progress in  treatment, Medication management and Plan is to:  -Continue Trazodone 50 mg PO QHS PRN for insomnia -Continue Vistaril 25 mg TID PRN for anxiety -Encourage group therapy participation  Maryfrances Bunnellravis B Maybell Misenheimer, FNP 11/03/2017, 1:44 PM

## 2017-11-04 DIAGNOSIS — F6381 Intermittent explosive disorder: Principal | ICD-10-CM

## 2017-11-04 NOTE — Progress Notes (Signed)
Recreation Therapy Notes  Date: 11/04/17 Time: 0930 Location: 300 [address] Dayroom  Group Topic: Stress Management  Goal Area(s) Addresses:  Patient will verbalize importance of using healthy stress management.  Patient will identify positive emotions associated with healthy stress management.   Behavioral Response: Engaged  Intervention: Stress Management  Activity :  Body Scan Meditation.  LRT introduced the stress management technique of meditation.  LRT played a meditation from the Calm app that allowed patients to take inventory of any sensations or tension they may have been experiencing.  Education:  Stress Management, Discharge Planning.   Education Outcome: Acknowledges edcuation/In group clarification offered/Needs additional education  Clinical Observations/Feedback: Pt attended group.    Caroll RancherMarjette Paizley Ramella, LRT/CTRS         Caroll RancherLindsay, Johnnie Moten A 11/04/2017 12:45 PM

## 2017-11-04 NOTE — Discharge Summary (Signed)
Physician Discharge Summary Note  Patient:  Jonathan Zamora is an 22 y.o., male MRN:  [id number] DOB:  Jul 29, 1996 Patient phone:  [phone] (home)  Patient address:   [address] [place] [address],  Total Time spent with patient: 20 minutes  Date of Admission:  11/01/2017 Date of Discharge: 11/04/17  Reason for Admission:  Expressed SI at home  Principal Problem: Intermittent explosive disorder Discharge Diagnoses: Patient Active Problem List   Diagnosis Date Noted  . Intermittent explosive disorder [F63.81] 11/01/2017  . DMDD (disruptive mood dysregulation disorder) (HCC) [F34.81] 08/11/2015  . History of ADHD [Z86.59] 08/10/2015  . Parent-child relational problem [Z62.820] 08/10/2015  . Aggressive behavior of adolescent [R46.89] 08/09/2015  . 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 Allergic rhinitis [J30.9] 05/31/2015  . Asthma [J45.909] 05/31/2015  . Atopic dermatitis [L20.9] 05/31/2015    Past Psychiatric History: History of ADHD as a child. He was treated with Clonidine and Stimulant medication. He has been off medications for over a year. Has a previous admission here for explosive behavior. Denies any past suicidal behavior.    Past Medical History:  Past Medical History:  Diagnosis Date  . ADHD (attention deficit hyperactivity disorder)   . Aggression   . Allergy   . Asthma   . Eczema   . History of ADHD 08/10/2015  . Parent-child relational problem 08/10/2015   History reviewed. No pertinent surgical history. Family History: History reviewed. No pertinent family history. Family Psychiatric  History: Denies any family history of mental illness, substance use disorder or suicide.   Social History:  Social History   Substance and Sexual Activity  Alcohol Use No  . Alcohol/week: 0.0 oz     Social History   Substance and Sexual Activity  Drug Use No    Social History   Socioeconomic History  . Marital status: Single     Spouse name: None  . Number of children: None  . Years of education: None  . Highest education level: None  Social Needs  . Financial resource strain: None  . Food insecurity - worry: None  . Food insecurity - inability: None  . Transportation needs - medical: None  . Transportation needs - non-medical: None  Occupational History  . None  Tobacco Use  . Smoking status: Never Smoker  . Smokeless tobacco: Never Used  Substance and Sexual Activity  . Alcohol use: No    Alcohol/week: 0.0 oz  . Drug use: No  . Sexual activity: Yes  Other Topics Concern  . None  Social History Narrative  . 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 Background history of .Intermittent Explosive Disorder. Presented to the ER via the police. His father committed him involuntarily on account of self harm. He was reported to have sent a text message to his mother that implied he was ready to end his own life. Last weekend he had some issues with his father. He called 911 and his father was arrested.  Routine labs are WNL.  Toxicology is negative. No alcohol or substances.  At interview, patient reports that he had been off medications for over a year. He has been attending therapy. Says he has been functioning well at school and at work. Says over the weekend he had an argument with his father. Says it started after his father asked him to tidy up. Says  later he got a text from his father with images of his room. Says when he came back they had an argument which escalated. Says when he sensed it was going to get physical, he called the police himself. Patient says he had a phone conversation with his mother and he was disappointed she shared it with her father. Says he only told her how disappointed she was. Repeatedly denies ever expressing any suicidal thoughts verbally or electronically. Says he showed his phone to the  police and his father. Feels his mother made up stories about him being a danger to himself. Tells me that he is not a danger to anyone or himself. Says he has been feeling good. Says he is more able to control his anger lately. He admits occasional use of THC. Denies use of any other substance. No synthetic substance use   He does not feel depressed. No associated psychosis. No evidence of mania. No overwhelming anxiety. No thoughts of harming others. No thoughts of violence. No access to weapons. Denies any other stressors.  Patient remained [facility] the [facility] unit for 2 days and stabilized with therapy and time away from his father. Patient was not started on any medications. He showed improvement with interacting, appetite, sleep, mood, and appetite. He was seen in the day room interacting appropriately. He has been attending group as well. His father confirmed that the argument and agitation is only with with the mother and the father. The father agrees that the patient is safe to come home and they have agreed to follow up with therapy together. The patient denies any SI/HI/AVH and contracts for safety. The patient agrees to follow up at [facility].   Physical Findings: AIMS: Facial and Oral Movements Muscles of Facial Expression: None, normal Lips and Perioral Area: None, normal Jaw: None, normal Tongue: None, normal,Extremity Movements Upper (arms, wrists, hands, fingers): None, normal Lower (legs, knees, ankles, toes): None, normal, Trunk Movements Neck, shoulders, hips: None, normal, Overall Severity Severity of abnormal movements (highest score from questions above): None, normal Incapacitation due to abnormal movements: None, normal Patient's awareness of abnormal movements (rate only patient's report): No Awareness, Dental Status Current problems with teeth and/or dentures?: No Does patient usually wear dentures?: No  CIWA:    COWS:     Musculoskeletal: Strength & Muscle  Tone: within normal limits Gait & Station: normal Patient leans: N/A  Psychiatric Specialty Exam: Physical Exam  Nursing note and vitals reviewed. Constitutional: He is oriented to person, place, and time. He appears well-developed and well-nourished.  Cardiovascular: Normal rate.  Respiratory: Effort normal.  Musculoskeletal: Normal range of motion.  Neurological: He is alert and oriented to person, place, and time.  Skin: Skin is warm.    Review of Systems  Constitutional: Negative.   HENT: Negative.   Eyes: Negative.   Respiratory: Negative.   Cardiovascular: Negative.   Gastrointestinal: Negative.   Genitourinary: Negative.   Musculoskeletal: Negative.   Skin: Negative.   Neurological: Negative.   Endo/Heme/Allergies: Negative.   Psychiatric/Behavioral: Negative.     Blood pressure 132/77, pulse 92, temperature 97.6 F (36.4 C), temperature source Oral, resp. rate 12, height 5' 10.25" (1.784 m), weight 128.7 kg (283 lb 12 oz).Body mass index is 40.42 kg/m.  General Appearance: Casual  Eye Contact:  Good  Speech:  Clear and Coherent and Normal Rate  Volume:  Normal  Mood:  Euthymic  Affect:  Appropriate  Thought Process:  Goal Directed and Descriptions of Associations: Intact  Orientation:  Full (Time, Place, and Person)  Thought Content:  WDL  Suicidal Thoughts:  No  Homicidal Thoughts:  No  Memory:  Immediate;   Good Recent;   Good Remote;   Good  Judgement:  Good  Insight:  Good  Psychomotor Activity:  Normal  Concentration:  Concentration: Good and Attention Span: Good  Recall:  Good  Fund of Knowledge:  Good  Language:  Good  Akathisia:  No  Handed:  Right  AIMS (if indicated):     Assets:  Communication Skills Desire for Improvement Financial Resources/Insurance Housing Physical Health Social Support Transportation  ADL's:  Intact  Cognition:  WNL  Sleep:  Number of Hours: 5.75     Have you used any form of tobacco in the last 30 days?  (Cigarettes, Smokeless Tobacco, Cigars, and/or Pipes): No  Has this patient used any form of tobacco in the last 30 days? (Cigarettes, Smokeless Tobacco, Cigars, and/or Pipes) Yes, No  Blood Alcohol level:  Lab Results  Component Value Date   ETH <10 11/01/2017   ETH <5 12/27/2015    Metabolic Disorder Labs:  No results found for: HGBA1C, MPG No results found for: PROLACTIN No results found for: CHOL, TRIG, HDL, CHOLHDL, VLDL, LDLCALC  See Psychiatric Specialty Exam and Suicide Risk Assessment completed by Attending Physician prior to discharge.  Discharge destination:  Home  Is patient on multiple antipsychotic therapies at discharge:  No   Has Patient had three or more failed trials of antipsychotic monotherapy by history:  No  Recommended Plan for Multiple Antipsychotic Therapies: NA   Allergies as of 11/04/2017      Reactions   Other Anaphylaxis   ALL Tree nuts      Medication List    STOP taking these medications   AUVI-Q 0.3 mg/0.3 mL Soaj injection Generic drug:  EPINEPHrine   guanFACINE 2 MG tablet Commonly known as:  TENEX     TAKE these medications     Indication  Albuterol Sulfate 108 (90 Base) MCG/ACT Aepb Commonly known as:  PROAIR RESPICLICK Inhale 2 puffs into the lungs every 4 (four) hours as needed.  Indication:  Per PCP      Follow-up Information    [facility] Follow up on 11/09/2017.   Specialty:  Psychology Why:  Follow up is scheduled for 11/09/17 at 8:30am with Dr. Langston Masker.   Contact information: [address] [place] [address] [phone]        [facility] Follow up on 11/15/2017.   Why:  Follow up is scheduled for 11/15/17 at 9am with French Ana.  Contact information: [address] [place] [address] [phone]           Follow-up recommendations:  Continue activity as tolerated. Continue diet as recommended by your PCP. Ensure to keep all appointments with  outpatient providers.  Comments:  Patient is instructed prior to discharge to: Take all medications as prescribed by his/her mental healthcare provider. Report any adverse effects and or reactions from the medicines to his/her outpatient provider promptly. Patient has been instructed & cautioned: To not engage in alcohol and or illegal drug use while on prescription medicines. In the event of worsening symptoms, patient is instructed to call the crisis hotline, 911 and or go to the nearest ED for appropriate evaluation and treatment of symptoms. To follow-up with his/her primary care provider for your other medical issues, concerns and or health care needs.  Signed: Gerlene Burdockravis B Damaree Sargent, FNP 11/04/2017, 9:11 AM

## 2017-11-04 NOTE — Progress Notes (Signed)
  [facility] Dis[facility] :  Will you be returning to the same living situation after discharge:  Yes,  with his father Darin EngelsReginald Page At discharge, do you have transportation home?: Yes,  patient's father Do you have the ability to pay for your medications: Yes,  BCBS, father's support  Release of information consent forms completed and in the chart;  Patient's signature needed at discharge.  Patient to Follow up at: Follow-up Information    [facility] Follow up on 11/09/2017.   Specialty:  Psychology Why:  Follow up is scheduled for 11/09/17 at 8:30am with Dr. Langston MaskerMorris.   Contact information: [address] [place] [address] [phone]        [facility] Follow up on 11/15/2017.   Why:  Follow up is scheduled for 11/15/17 at 9am with French Anaracy.  Contact information: [address] [place] [address] [phone]           Next level of care provider [facility] to [facility] Link:yes  Safety Planning and Suicide Prevention discussed: Yes,  with the patient's father  Have you used any form of tobacco in the last 30 days? (Cigarettes, Smokeless Tobacco, Cigars, and/or Pipes): No  Has patient been referred to the Quitline?: N/A patient is not a smoker  Patient has been referred for addiction treatment: N/A  Maeola SarahJolan E Cyara Devoto, LCSWA 11/04/2017, 11:17 AM

## 2017-11-04 NOTE — Progress Notes (Signed)
Discharge Note:  Patient discharged home with family.  Patient denied SI and HI.  Denied A/V hallucinations.  Denied pain.  Suicide prevention information given and discussed with patient who stated he understood and had no questions.  Patient stated he received all his belongings, etc.  Patient stated he [facility].  All required discharge information given to patient at discharge.

## 2017-11-04 NOTE — [facility] Suicide Risk Assessment (Signed)
[facility]   Principal Problem: Intermittent explosive disorder Discharge Diagnoses:  Patient Active Problem List   Diagnosis Date Noted  . Intermittent explosive disorder [F63.81] 11/01/2017  . DMDD (disruptive mood dysregulation disorder) (HCC) [F34.81] 08/11/2015  . History of ADHD [Z86.59] 08/10/2015  . Parent-child relational problem [Z62.820] 08/10/2015  . Aggressive behavior of adolescent [R46.89] 08/09/2015  . 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 Allergic rhinitis [J30.9] 05/31/2015  . Asthma [J45.909] 05/31/2015  . Atopic dermatitis [L20.9] 05/31/2015    Total Time spent with patient: 45 minutes  Musculoskeletal: Strength & Muscle Tone: within normal limits Gait & Station: normal Patient leans: N/A  Psychiatric Specialty Exam: Review of Systems  Constitutional: Negative.   HENT: Negative.   Eyes: Negative.   Respiratory: Negative.   Cardiovascular: Negative.   Gastrointestinal: Negative.   Genitourinary: Negative.   Musculoskeletal: Negative.   Skin: Negative.   Neurological: Negative.   Endo/Heme/Allergies: Negative.   Psychiatric/Behavioral: Negative for depression, hallucinations, memory loss, substance abuse and suicidal ideas. The patient is not nervous/anxious and does not have insomnia.     Blood pressure 132/77, pulse 92, temperature 97.6 F (36.4 C), temperature source Oral, resp. rate 12, height 5' 10.25" (1.784 m), weight 128.7 kg (283 lb 12 oz).Body mass index is 40.42 kg/m.  General Appearance: Neatly dressed, pleasant, engaging well and cooperative. Appropriate behavior. Not in any distress. Good relatedness. Not internally stimulated.  Eye Contact::  Good  Speech:  Spontaneous, normal prosody. Normal tone and rate.   Volume:  Normal  Mood:  Euthymic  Affect:  Appropriate and Full Range  Thought Process:  Linear  Orientation:  Full (Time, Place, and Person)  Thought Content:  No delusional  theme. No preoccupation with violent thoughts. No negative ruminations. No obsession.  No hallucination in any modality.   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 Background history of .Intermittent Explosive Disorder. Presented to the ER via the police. His father committed him involuntarily on account of self harm. He was reported to have sent a text message to his mother that implied he was ready to end his own life. Last weekend he had some issues with his father. He called 911 and his father was arrested. Routine labs are WNL. Toxicology is negative. No alcohol or substances.   Seen today. Says he has been feeling good. He is working with his father towards mending their relationship. His mother lives out of state and he does not want her involved. He plans to attend counseling with his father. He plans to keep his psychiatry appointment and explore medications with his psychiatrist. Reports that he is in good spirits. Not feeling depressed. Reports normal energy and interest. Has been maintaining normal biological functions. He is able to think clearly. He is able to focus on task. His thoughts are not crowded or racing.  No evidence of mania. No hallucination in any modality. He is not making any delusional statement. No passivity of will/thought. He is fully in touch with reality. No thoughts of suicide. No thoughts of homicide. No  violent thoughts. No overwhelming anxiety. No access to weapons. Denies any new stressors at home.   Nursing staff reports that patient has been appropriate on the unit. Patient has been interacting well with peers. No behavioral issues. Patient has not voiced any suicidal thoughts. Patient has not been observed to be internally stimulated. Patient has been adherent with treatment recommendations. Patient has been tolerating their medication well.   Patient was discussed at team. Team members feels that patient is back to his baseline level of function. Team agrees with plan to discharge patient today.      Demographic Factors:  Male  Loss Factors: NA  Historical Factors: Impulsivity  Risk Reduction Factors:   Sense of responsibility to family, Living with another person, especially a relative, Positive social support, Positive therapeutic relationship and Positive coping skills or problem solving skills  Continued Clinical Symptoms:  As above   Cognitive Features That Contribute To Risk:  None    Suicide Risk:  Minimal: No identifiable suicidal ideation.  Patient is not having any thoughts of suicide at this time. Modifiable risk factors targeted during this admission includes impulse control disorder. Demographical and historical risk factors cannot be modified. Patient is now engaging well. Patient is reliable and is future oriented. We have buffered patient's support structures. At this point, patient is at low risk of suicide. Patient is aware of the effects of psychoactive substances on decision making process. Patient has been provided with emergency contacts. Patient acknowledges to use resources provided if unforseen circumstances changes their current risk stratification.    Follow-up Information    [facility] Follow up on 11/09/2017.   Specialty:  Psychology Why:  Follow up is scheduled for 11/09/17 at 8:30am with Dr. Langston Masker.   Contact  information: [address] [place] [address] [phone]        [facility] Follow up on 11/15/2017.   Why:  Follow up is scheduled for 11/15/17 at 9am with French Ana.  Contact information: [address] [place] [address] [phone]           Plan Of Care/Follow-up recommendations:  1. Continue current psychotropic medications 2. Mental health and addiction follow up as arranged.  3. Discharge in care of her family 4. Provided limited quantity of prescriptions   Georgiann Cocker, MD 11/04/2017, 9:34 AM

## 2017-11-04 NOTE — [facility] Group Notes (Signed)
Orientation / Goals group   Date:  11/04/2017  Time:  9:36 AM  Type of Therapy:  Nurse Education  The group focuses on teaching patients who their staff is, what the staff responsibilities are and what the unit programming / scheduling is. Additionally, SMART goal setting was introduced.  Participation Level:  Active  Participation Quality:  Appropriate  Affect:  Appropriate  Cognitive:  Alert  Insight:  Appropriate  Engagement in Group:  Engaged  Modes of Intervention:  Education  Summary of Progress/Problems:  Rich BraveDuke, Sharlyn Odonnel Lynn 11/04/2017, 9:36 AM

## 2017-11-04 NOTE — Progress Notes (Signed)
D:  Patient's self inventory sheet, patient has fair sleep, no sleep medication given.  Good appetite, normal energy level, good concentration.  Denied depression, hopeless and anxiety.  Denied withdrawals.  Denied SI.  Denied physical problems.  Denied pain.  Goal is discharge.  Plans to talk to MD/SW.  No discharge plans. A:  Medications administered per MD orders.  Emotional support and encouragement given patient. R:  Denied SI and HI, contracts for safety.  Denied A/V hallucinations.  Safety maintained with 15 minute checks.

## 2017-11-04 NOTE — Progress Notes (Signed)
DAR NOTE: Patient presents with anxious affect and mood. Pt denied any anxiety, depression, pain, AVH or SI; "There was a big misunderstanding; I should have come here in the first place." Support and encouragement provided to the Pt. All patient's questions and concerns addressed. Pt attended wrap-up group.

## 2019-03-16 ENCOUNTER — Other Ambulatory Visit: Payer: Self-pay | Admitting: Internal Medicine

## 2019-03-22 LAB — NOVEL CORONAVIRUS, NAA: SARS-CoV-2, NAA: NOT DETECTED

## 2019-04-19 ENCOUNTER — Emergency Department (HOSPITAL_BASED_OUTPATIENT_CLINIC_OR_DEPARTMENT_OTHER)
Admission: EM | Admit: 2019-04-19 | Discharge: 2019-04-19 | Disposition: A | Discharge status: Home / Self Care | Payer: 59 | Attending: Emergency Medicine | Admitting: Emergency Medicine

## 2019-04-19 ENCOUNTER — Other Ambulatory Visit: Payer: Self-pay

## 2019-04-19 ENCOUNTER — Encounter (HOSPITAL_BASED_OUTPATIENT_CLINIC_OR_DEPARTMENT_OTHER): Payer: Self-pay

## 2019-04-19 DIAGNOSIS — M79661 Pain in right lower leg: Secondary | ICD-10-CM | POA: Insufficient documentation

## 2019-04-19 DIAGNOSIS — M79604 Pain in right leg: Secondary | ICD-10-CM

## 2019-04-19 DIAGNOSIS — J45909 Unspecified asthma, uncomplicated: Secondary | ICD-10-CM | POA: Diagnosis not present

## 2019-04-19 LAB — COMPREHENSIVE METABOLIC PANEL
ALT: 20 U/L (ref 0–44)
AST: 21 U/L (ref 15–41)
Albumin: 4.3 g/dL (ref 3.5–5.0)
Alkaline Phosphatase: 69 U/L (ref 38–126)
Anion gap: 11 (ref 5–15)
BUN: 11 mg/dL (ref 6–20)
CO2: 22 mmol/L (ref 22–32)
Calcium: 9.1 mg/dL (ref 8.9–10.3)
Chloride: 105 mmol/L (ref 98–111)
Creatinine, Ser: 1 mg/dL (ref 0.61–1.24)
GFR calc Af Amer: >60 mL/min (ref >60)
GFR calc non Af Amer: >60 mL/min (ref >60)
Glucose, Bld: 93 mg/dL (ref 70–99)
Potassium: 4 mmol/L (ref 3.5–5.1)
Sodium: 138 mmol/L (ref 135–145)
Total Bilirubin: 0.8 mg/dL (ref 0.3–1.2)
Total Protein: 7 g/dL (ref 6.5–8.1)

## 2019-04-19 LAB — CBC WITH DIFFERENTIAL/PLATELET
Abs Immature Granulocytes: 0.05 10*3/uL (ref 0.00–0.07)
Basophils Absolute: 0.1 10*3/uL (ref 0.0–0.1)
Basophils Relative: 1 %
Eosinophils Absolute: 0.4 10*3/uL (ref 0.0–0.5)
Eosinophils Relative: 4 %
HCT: 45.9 % (ref 39.0–52.0)
Hemoglobin: 15 g/dL (ref 13.0–17.0)
Immature Granulocytes: 1 %
Lymphocytes Relative: 38 %
Lymphs Abs: 4 10*3/uL (ref 0.7–4.0)
MCH: 29.3 pg (ref 26.0–34.0)
MCHC: 32.7 g/dL (ref 30.0–36.0)
MCV: 89.6 fL (ref 80.0–100.0)
Monocytes Absolute: 0.7 10*3/uL (ref 0.1–1.0)
Monocytes Relative: 7 %
Neutro Abs: 5.3 10*3/uL (ref 1.7–7.7)
Neutrophils Relative %: 49 %
Platelets: 282 10*3/uL (ref 150–400)
RBC: 5.12 MIL/uL (ref 4.22–5.81)
RDW: 12.5 % (ref 11.5–15.5)
WBC: 10.5 10*3/uL (ref 4.0–10.5)
nRBC: 0 % (ref 0.0–0.2)

## 2019-04-19 LAB — CK: Total CK: 233 U/L (ref 49–397)

## 2019-04-19 MED ORDER — ACETAMINOPHEN 500 MG PO TABS
1000.0000 mg | ORAL_TABLET | Freq: Once | ORAL | Status: AC
  Administered 2019-04-19: 1000 mg via ORAL
  Filled 2019-04-19: qty 2

## 2019-04-19 MED ORDER — KETOROLAC TROMETHAMINE 30 MG/ML IJ SOLN
30.0000 mg | Freq: Once | INTRAMUSCULAR | Status: AC
  Administered 2019-04-19: 21:00:00 30 mg via INTRAVENOUS
  Filled 2019-04-19: qty 1

## 2019-04-19 NOTE — ED Provider Notes (Signed)
[facility] EMERGENCY DEPARTMENT Provider Note   CSN: [id number] Arrival date & time: 04/19/19  1951    History   Chief Complaint Chief Complaint  Patient presents with  . Leg Pain    HPI Jonathan Zamora is a 23 y.o. male.     Patient complains of right calf pain that started this morning and gradually worsened.  He says the pain is similar to a muscle cramp but did not go away.  Patient took some magnesium thinking he had a cramp earlier today.  Patient works at [employer] as a Conservation officer, naturecashier, but does not do any heavy lifting at work, and does not do any strenuous exercise at home.  Patient has not been any long car rides or plane rides recently.  Patient has no personal or family history of clotting disorder or blood clots.  Patient denies shortness of breath.     Past Medical History:  Diagnosis Date  . ADHD (attention deficit hyperactivity disorder)   . Aggression   . Allergy   . Asthma   . Eczema   . History of ADHD 08/10/2015  . Parent-child relational problem 08/10/2015    Patient Active Problem List   Diagnosis Date Noted  . Intermittent explosive disorder 11/01/2017  . DMDD (disruptive mood dysregulation disorder) (HCC) 08/11/2015  . History of ADHD 08/10/2015  . Parent-child relational problem 08/10/2015  . Aggressive behavior of adolescent 08/09/2015  . 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 Allergic rhinitis 05/31/2015  . Asthma 05/31/2015  . Atopic dermatitis 05/31/2015    History reviewed. No pertinent surgical history.      Home Medications    Prior to Admission medications   Medication Sig Start Date End Date Taking? Authorizing Provider  Albuterol Sulfate (PROAIR RESPICLICK) 108 (90 Base) MCG/ACT AEPB Inhale 2 puffs into the lungs every 4 (four) hours as needed. Patient not taking: Reported on 11/01/2017 02/01/17   Jessica PriestKozlow, Eric J, MD    Family History No family history on file.  Social History Social History   Tobacco  Use  . Smoking status: Never Smoker  . Smokeless tobacco: Never Used  Substance Use Topics  . Alcohol use: Yes    Alcohol/week: 0.0 standard drinks    Comment: occ  . Drug use: Yes    Types: Marijuana     Allergies   Other   Review of Systems Review of Systems  Constitutional: Negative for fatigue and fever.  Eyes: Negative for visual disturbance.  Respiratory: Negative for cough, chest tightness and shortness of breath.   Cardiovascular: Negative for chest pain.  Gastrointestinal: Negative for abdominal pain, constipation, diarrhea, nausea and vomiting.  Musculoskeletal: Negative for neck pain.  Skin: Negative for wound.  Neurological: Negative for dizziness, syncope and headaches.  Psychiatric/Behavioral: Negative for confusion.     Physical Exam Updated Vital Signs BP (!) 127/92 (BP Location: Left Arm)   Pulse 79   Temp 98.9 F (37.2 C) (Oral)   Resp 20   Ht 6' (1.829 m)   Wt 126.1 kg   SpO2 100%   BMI 37.70 kg/m   Physical Exam Constitutional:      General: He is not in acute distress.    Appearance: Normal appearance. He is not ill-appearing.  HENT:     Head: Normocephalic and atraumatic.     Nose: Nose normal. No congestion or rhinorrhea.     Mouth/Throat:     Mouth: Mucous membranes are moist.  Pharynx: Oropharynx is clear.  Eyes:     Extraocular Movements: Extraocular movements intact.     Conjunctiva/sclera: Conjunctivae normal.     Pupils: Pupils are equal, round, and reactive to light.  Neck:     Musculoskeletal: Normal range of motion and neck supple.  Cardiovascular:     Rate and Rhythm: Normal rate and regular rhythm.     Pulses: Normal pulses.     Heart sounds: Normal heart sounds. No murmur.  Pulmonary:     Effort: Pulmonary effort is normal.     Breath sounds: Normal breath sounds. No wheezing or rhonchi.  Abdominal:     Tenderness: There is no abdominal tenderness. There is no guarding.  Musculoskeletal: Normal range of motion.         General: Swelling and tenderness present.     Right lower leg: No edema.     Left lower leg: No edema.     Comments: Right calf approximately 1 inch greater in circumference than left.  With dorsiflexion of right ankle  Lymphadenopathy:     Cervical: No cervical adenopathy.  Skin:    General: Skin is warm and dry.  Neurological:     General: No focal deficit present.     Mental Status: He is alert and oriented to person, place, and time. Mental status is at baseline.     Cranial Nerves: No cranial nerve deficit.  Psychiatric:        Mood and Affect: Mood normal.        Behavior: Behavior normal.      ED Treatments / Results  Labs (all labs ordered are listed, but only abnormal results are displayed) Labs Reviewed  CBC WITH DIFFERENTIAL/PLATELET  COMPREHENSIVE METABOLIC PANEL  CK    EKG None  Radiology No results found.  Procedures Procedures (including critical care time)  Medications Ordered in ED Medications  acetaminophen (TYLENOL) tablet 1,000 mg (1,000 mg Oral Given 04/19/19 2101)  ketorolac (TORADOL) 30 MG/ML injection 30 mg (30 mg Intravenous Given 04/19/19 2113)     Initial Impression / Assessment and Plan / ED Course  I have reviewed the triage vital signs and the nursing notes.  Pertinent labs & imaging results that were available during my care of the patient were reviewed by me and considered in my medical decision making (see chart for details).       Is a [age]-year-old male with a day history of Right pain and swelling.  Pain is been constant throughout most of the day.  Patient not had this issue prior, although has had leg cramps past that felt similar.  Patient took magnesium earlier today but no other medications.  Patient with no history suggesting trauma or muscle strain, and has not had any periods of extended inactivity.  No family history or personal history of clots.  CBC, CMP, CK all normal.  Dr. Silverio LayYao attempted to visualize the right  lower extremity for deep vein thrombosis, but exam is limited due to patient's body habitus.  Patient was advised to come back to the med center the following week for scheduled ultrasound the ultrasound tension for a complete evaluation of DVT.  Patient was discharged and advised to come back sooner if he develops shortness of, chest pain or other symptoms of pulmonary embolism.  Final Clinical Impressions(s) / ED Diagnoses   Final diagnoses:  Right leg pain    ED Discharge Orders         Ordered  US Venous Img Lower Unilateral Right     04/19/19 2054           Benay Pike, MD 04/20/19 1216    Drenda Freeze, MD 04/23/19 1059

## 2019-04-19 NOTE — ED Triage Notes (Signed)
Pt c/o pain to right calf x today-denies injury-states he was sent from [facility] due to "2cm larger than left" and to r/o DVT-NAD-steady gait

## 2019-04-19 NOTE — Discharge Instructions (Addendum)
Please return to the med center tomorrow around 10 AM so that we can do a full DVT ultrasound of your right lower extremity.  If you start to develop shortness of breath before then, this would be a reason to come back to the emergency department.  Meanwhile you can take Tylenol for pain.  You get this over-the-counter.

## 2019-04-20 ENCOUNTER — Ambulatory Visit (HOSPITAL_BASED_OUTPATIENT_CLINIC_OR_DEPARTMENT_OTHER)
Admission: RE | Admit: 2019-04-20 | Discharge: 2019-04-20 | Disposition: A | Discharge status: Home / Self Care | Payer: 59 | Source: Ambulatory Visit | Attending: Emergency Medicine | Admitting: Emergency Medicine

## 2019-04-20 DIAGNOSIS — R600 Localized edema: Secondary | ICD-10-CM | POA: Diagnosis not present

## 2019-04-20 DIAGNOSIS — M7989 Other specified soft tissue disorders: Secondary | ICD-10-CM | POA: Diagnosis present

## 2019-04-20 DIAGNOSIS — M79604 Pain in right leg: Secondary | ICD-10-CM | POA: Diagnosis not present

## 2019-09-18 ENCOUNTER — Ambulatory Visit: Payer: 59 | Attending: Internal Medicine

## 2019-09-18 DIAGNOSIS — Z20822 Contact with and (suspected) exposure to covid-19: Secondary | ICD-10-CM

## 2019-09-19 LAB — NOVEL CORONAVIRUS, NAA: SARS-CoV-2, NAA: NOT DETECTED

## 2019-12-22 ENCOUNTER — Ambulatory Visit: Payer: Self-pay | Attending: Family

## 2019-12-22 DIAGNOSIS — Z23 Encounter for immunization: Secondary | ICD-10-CM

## 2019-12-22 NOTE — Progress Notes (Signed)
   Covid-19 Vaccination Clinic  Name:  Jonathan Zamora    MRN: [id number] DOB: [birth year]  12/22/2019  [name] was observed post Covid-19 immunization for 15 minutes without incident. He was provided with Vaccine Information Sheet and instruction to access the V-Safe system.   [name] was instructed to call 911 with any severe reactions post vaccine: Marland Kitchen Difficulty breathing  . Swelling of face and throat  . A fast heartbeat  . A bad rash all over body  . Dizziness and weakness   Immunizations Administered    Name Date Dose VIS Date Route   JANSSEN COVID-19 VACCINE 12/22/2019 10:57 AM 0.5 mL 11/17/2019 Intramuscular   Manufacturer: [name]   Lot: [id number]   NDC: 63494-944-73

## 2020-09-29 ENCOUNTER — Ambulatory Visit: Payer: 59 | Admitting: Nurse Practitioner

## 2021-05-09 IMAGING — US RIGHT LOWER EXTREMITY VENOUS ULTRASOUND
1 series · 13 of 24 positions shown · non-contrast
Comparison: None.

CLINICAL DATA: Right lower extremity pain and edema for the past
day. Evaluate for DVT.



[Series 1: right lower extremity venous ultrasound · 13 of 30 slices shown]
[im 1/30]
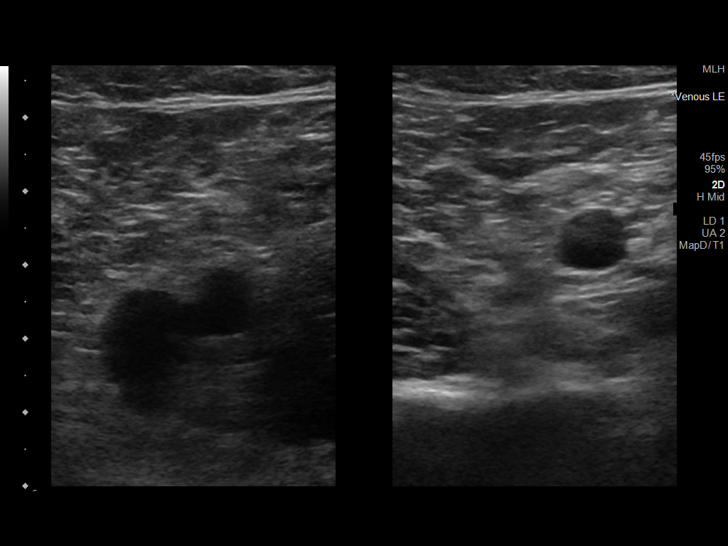
[im 3/30]
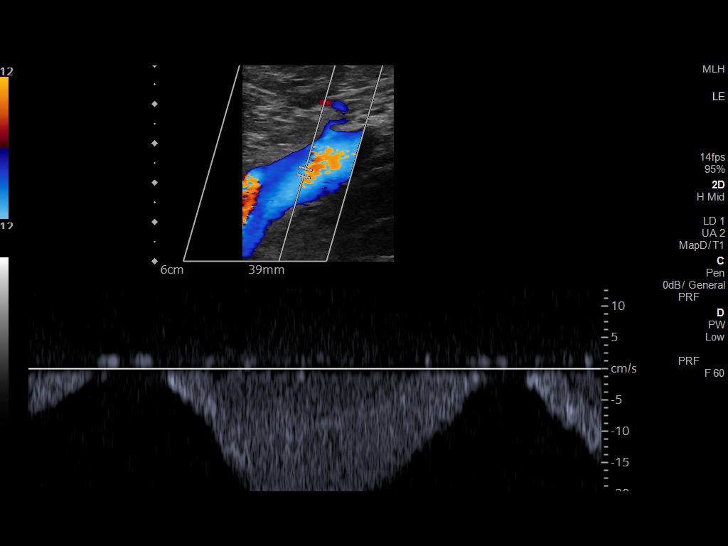
[im 6/30]
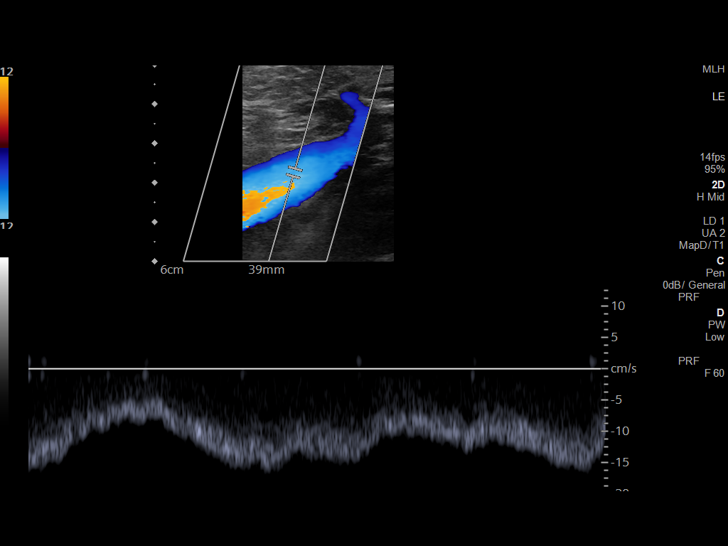
[im 8/30]
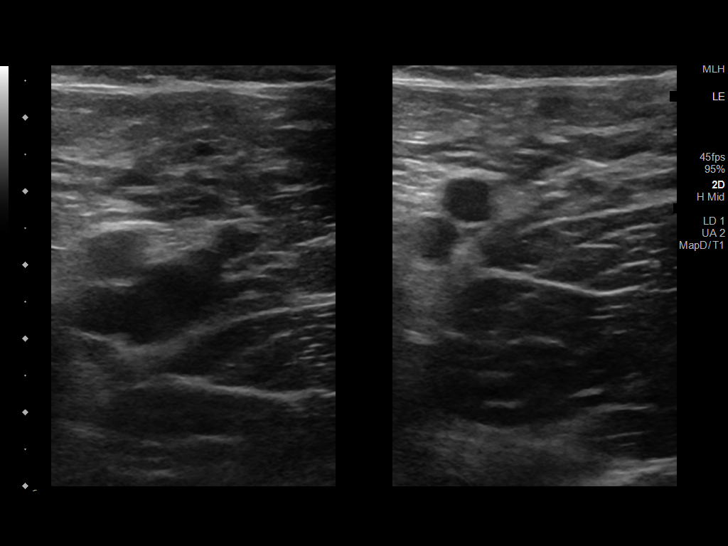
[im 11/30]
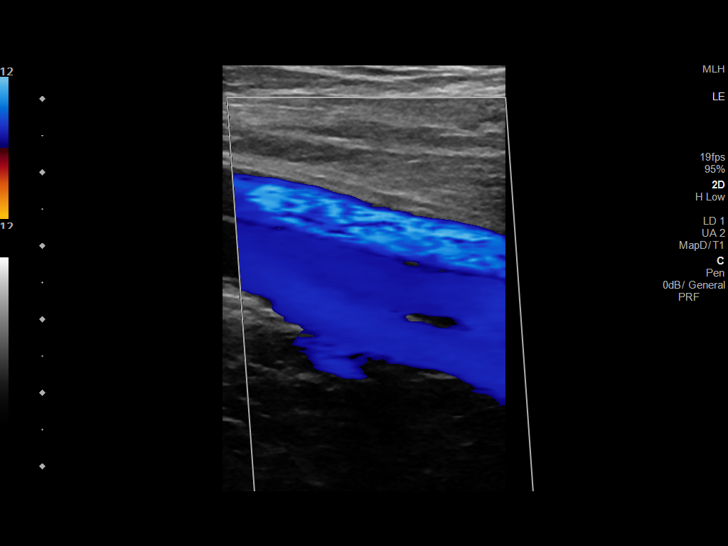
[im 13/30]
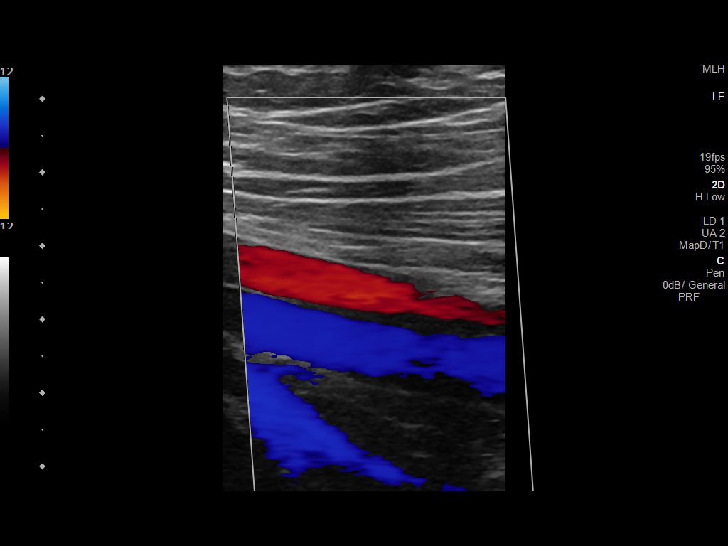
[im 16/30]
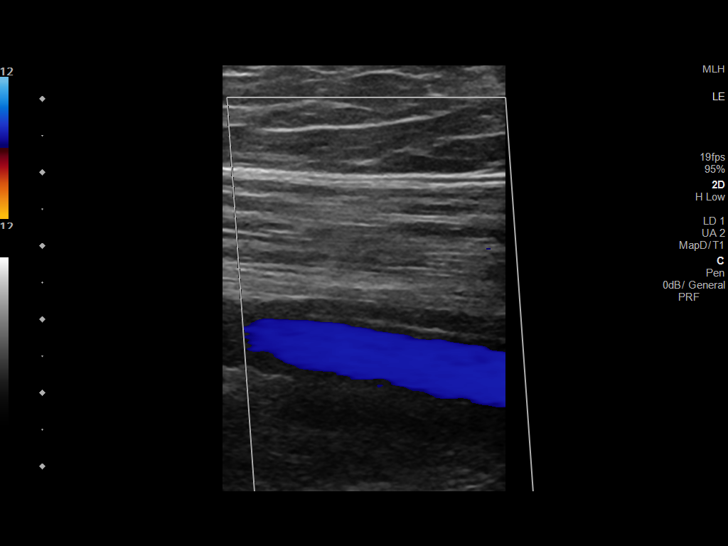
[im 17/30]
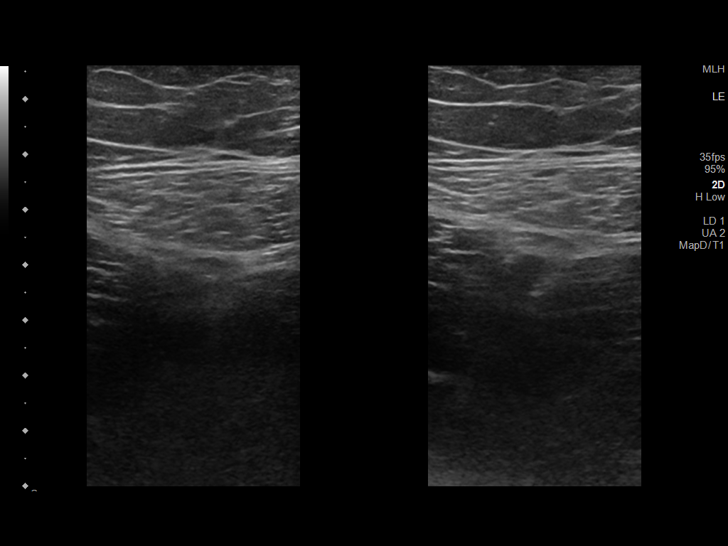
[im 19/30]
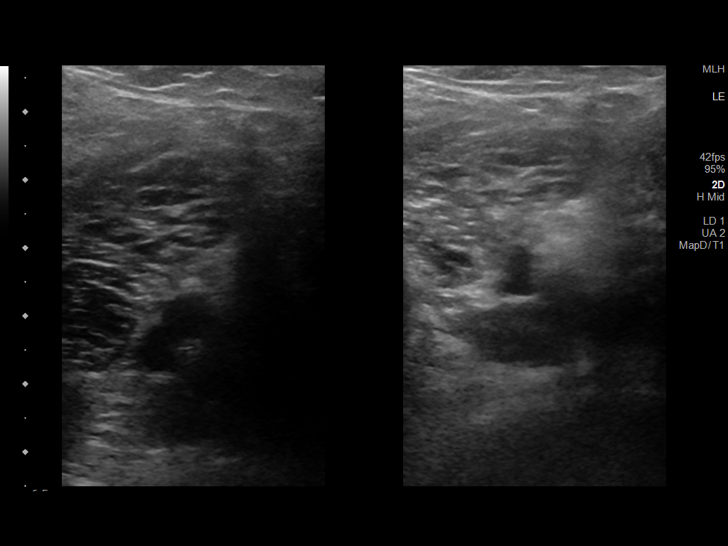
[im 22/30]
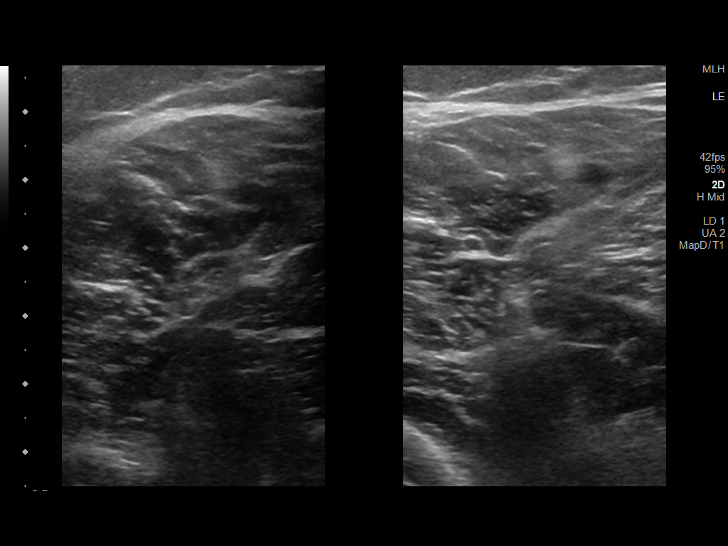
[im 24/30]
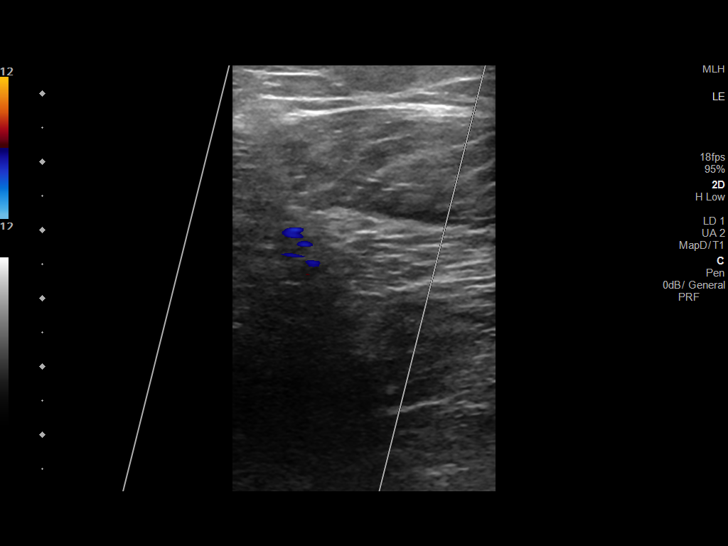
[im 27/30]
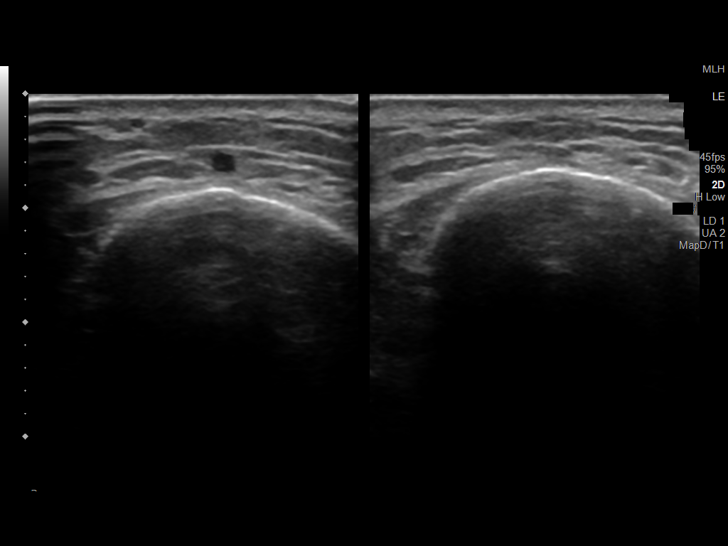
[im 30/30]
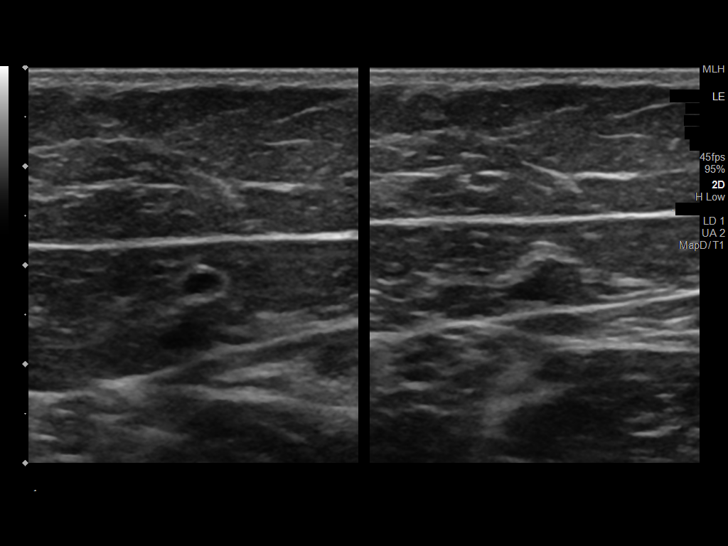

[13 of 24 positions shown; findings below may reference images not displayed]

FINDINGS: Contralateral Common Femoral Vein: Respiratory phasicity is normal
and symmetric with the symptomatic side. No evidence of thrombus.
Normal compressibility.

Common Femoral Vein: No evidence of thrombus. Normal
compressibility, respiratory phasicity and response to augmentation.

Saphenofemoral Junction: No evidence of thrombus. Normal
compressibility and flow on color Doppler imaging.

Profunda Femoral Vein: No evidence of thrombus. Normal
compressibility and flow on color Doppler imaging.

Femoral Vein: No evidence of thrombus. Normal compressibility,
respiratory phasicity and response to augmentation.

Popliteal Vein: No evidence of thrombus. Normal compressibility,
respiratory phasicity and response to augmentation.

Calf Veins: No evidence of thrombus. Normal compressibility and flow
on color Doppler imaging.

Superficial Great Saphenous Vein: No evidence of thrombus. Normal
compressibility.

Venous Reflux:  None.

Other Findings:  None.
IMPRESSION: Right lower extremity pain and edema for the past day. Evaluate for
DVT.
